# Patient Record
Sex: Female | Born: 1950 | Race: White | Hispanic: No | Marital: Married | Smoking: Never smoker
Health system: Southern US, Community
[De-identification: ages and names within clinical notes are randomized; demographics above are authoritative.]

## PROBLEM LIST (undated history)

## (undated) DIAGNOSIS — R7303 Prediabetes: Secondary | ICD-10-CM

## (undated) DIAGNOSIS — B019 Varicella without complication: Secondary | ICD-10-CM

## (undated) DIAGNOSIS — R51 Headache: Secondary | ICD-10-CM

## (undated) DIAGNOSIS — K219 Gastro-esophageal reflux disease without esophagitis: Secondary | ICD-10-CM

## (undated) DIAGNOSIS — K589 Irritable bowel syndrome without diarrhea: Secondary | ICD-10-CM

## (undated) DIAGNOSIS — Z9889 Other specified postprocedural states: Secondary | ICD-10-CM

## (undated) DIAGNOSIS — M810 Age-related osteoporosis without current pathological fracture: Secondary | ICD-10-CM

## (undated) DIAGNOSIS — I890 Lymphedema, not elsewhere classified: Secondary | ICD-10-CM

## (undated) DIAGNOSIS — M1990 Unspecified osteoarthritis, unspecified site: Secondary | ICD-10-CM

## (undated) DIAGNOSIS — R519 Headache, unspecified: Secondary | ICD-10-CM

## (undated) DIAGNOSIS — E785 Hyperlipidemia, unspecified: Secondary | ICD-10-CM

## (undated) DIAGNOSIS — E7800 Pure hypercholesterolemia, unspecified: Secondary | ICD-10-CM

## (undated) DIAGNOSIS — H25013 Cortical age-related cataract, bilateral: Secondary | ICD-10-CM

## (undated) DIAGNOSIS — I10 Essential (primary) hypertension: Secondary | ICD-10-CM

## (undated) DIAGNOSIS — R112 Nausea with vomiting, unspecified: Secondary | ICD-10-CM

## (undated) DIAGNOSIS — J45909 Unspecified asthma, uncomplicated: Secondary | ICD-10-CM

## (undated) HISTORY — DX: Headache, unspecified: R51.9

## (undated) HISTORY — DX: Essential (primary) hypertension: I10

## (undated) HISTORY — DX: Headache: R51

## (undated) HISTORY — DX: Varicella without complication: B01.9

## (undated) HISTORY — PX: APPENDECTOMY: SHX54

## (undated) HISTORY — DX: Unspecified asthma, uncomplicated: J45.909

## (undated) HISTORY — PX: FOOT SURGERY: SHX648

## (undated) HISTORY — PX: TONSILLECTOMY: SUR1361

## (undated) HISTORY — PX: ABDOMINAL HYSTERECTOMY: SHX81

---
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
## 2002-07-25 HISTORY — PX: ABDOMINAL HYSTERECTOMY: SHX81

## 2008-07-23 ENCOUNTER — Inpatient Hospital Stay: Payer: Self-pay | Admitting: Specialist

## 2009-10-30 ENCOUNTER — Ambulatory Visit: Payer: Self-pay | Admitting: Family Medicine

## 2010-02-25 ENCOUNTER — Inpatient Hospital Stay: Payer: Self-pay | Admitting: Internal Medicine

## 2010-11-29 ENCOUNTER — Ambulatory Visit: Payer: Self-pay | Admitting: Family Medicine

## 2010-12-06 ENCOUNTER — Observation Stay: Payer: Self-pay | Admitting: Internal Medicine

## 2012-07-04 ENCOUNTER — Ambulatory Visit: Payer: Self-pay | Admitting: Podiatry

## 2013-12-03 DIAGNOSIS — I10 Essential (primary) hypertension: Secondary | ICD-10-CM | POA: Insufficient documentation

## 2013-12-03 DIAGNOSIS — G609 Hereditary and idiopathic neuropathy, unspecified: Secondary | ICD-10-CM | POA: Insufficient documentation

## 2013-12-03 DIAGNOSIS — G43909 Migraine, unspecified, not intractable, without status migrainosus: Secondary | ICD-10-CM | POA: Insufficient documentation

## 2013-12-03 DIAGNOSIS — L309 Dermatitis, unspecified: Secondary | ICD-10-CM | POA: Insufficient documentation

## 2013-12-03 DIAGNOSIS — S52599A Other fractures of lower end of unspecified radius, initial encounter for closed fracture: Secondary | ICD-10-CM | POA: Insufficient documentation

## 2013-12-03 DIAGNOSIS — F419 Anxiety disorder, unspecified: Secondary | ICD-10-CM | POA: Insufficient documentation

## 2014-02-17 ENCOUNTER — Ambulatory Visit: Payer: Self-pay | Admitting: Family Medicine

## 2014-03-19 ENCOUNTER — Ambulatory Visit: Payer: Self-pay | Admitting: Family Medicine

## 2015-03-11 ENCOUNTER — Other Ambulatory Visit: Payer: Self-pay | Admitting: Family Medicine

## 2015-03-11 DIAGNOSIS — Z1239 Encounter for other screening for malignant neoplasm of breast: Secondary | ICD-10-CM

## 2015-03-23 ENCOUNTER — Ambulatory Visit
Admission: RE | Admit: 2015-03-23 | Discharge: 2015-03-23 | Disposition: A | Discharge status: Home / Self Care | Payer: 59 | Source: Ambulatory Visit | Attending: Family Medicine | Admitting: Family Medicine

## 2015-03-23 DIAGNOSIS — Z1231 Encounter for screening mammogram for malignant neoplasm of breast: Secondary | ICD-10-CM | POA: Insufficient documentation

## 2015-03-23 DIAGNOSIS — Z1239 Encounter for other screening for malignant neoplasm of breast: Secondary | ICD-10-CM

## 2016-02-11 ENCOUNTER — Encounter: Payer: Self-pay | Admitting: Family Medicine

## 2016-02-11 ENCOUNTER — Ambulatory Visit (INDEPENDENT_AMBULATORY_CARE_PROVIDER_SITE_OTHER): Payer: 59 | Admitting: Family Medicine

## 2016-02-11 VITALS — BP 132/90 | HR 52 | Ht 62.0 in | Wt 149.6 lb

## 2016-02-11 DIAGNOSIS — Z0000 Encounter for general adult medical examination without abnormal findings: Secondary | ICD-10-CM | POA: Diagnosis not present

## 2016-02-11 DIAGNOSIS — Z130 Encounter for screening for diseases of the blood and blood-forming organs and certain disorders involving the immune mechanism: Secondary | ICD-10-CM

## 2016-02-11 DIAGNOSIS — I10 Essential (primary) hypertension: Secondary | ICD-10-CM | POA: Diagnosis not present

## 2016-02-11 DIAGNOSIS — E663 Overweight: Secondary | ICD-10-CM

## 2016-02-11 LAB — LIPID PANEL
CHOLESTEROL: 202 mg/dL — AB (ref 0–200)
HDL: 64.4 mg/dL (ref >39.00)
LDL CALC: 126 mg/dL — AB (ref 0–99)
NonHDL: 138.02
Total CHOL/HDL Ratio: 3
Triglycerides: 61 mg/dL (ref 0.0–149.0)
VLDL: 12.2 mg/dL (ref 0.0–40.0)

## 2016-02-11 LAB — CBC
HEMATOCRIT: 43.2 % (ref 36.0–46.0)
HEMOGLOBIN: 14.2 g/dL (ref 12.0–15.0)
MCHC: 32.8 g/dL (ref 30.0–36.0)
MCV: 90.6 fl (ref 78.0–100.0)
PLATELETS: 248 10*3/uL (ref 150.0–400.0)
RBC: 4.76 Mil/uL (ref 3.87–5.11)
RDW: 13.6 % (ref 11.5–15.5)
WBC: 5.4 10*3/uL (ref 4.0–10.5)

## 2016-02-11 LAB — COMPREHENSIVE METABOLIC PANEL
ALBUMIN: 4.2 g/dL (ref 3.5–5.2)
ALK PHOS: 84 U/L (ref 39–117)
ALT: 20 U/L (ref 0–35)
AST: 25 U/L (ref 0–37)
BUN: 14 mg/dL (ref 6–23)
CHLORIDE: 102 meq/L (ref 96–112)
CO2: 29 mEq/L (ref 19–32)
CREATININE: 0.88 mg/dL (ref 0.40–1.20)
Calcium: 9.8 mg/dL (ref 8.4–10.5)
GFR: 68.61 mL/min (ref >60.00)
Glucose, Bld: 85 mg/dL (ref 70–99)
Potassium: 4.2 mEq/L (ref 3.5–5.1)
SODIUM: 139 meq/L (ref 135–145)
TOTAL PROTEIN: 7.1 g/dL (ref 6.0–8.3)
Total Bilirubin: 0.6 mg/dL (ref 0.2–1.2)

## 2016-02-11 LAB — HEMOGLOBIN A1C: HEMOGLOBIN A1C: 5.5 % (ref 4.6–6.5)

## 2016-02-11 MED ORDER — CLOBETASOL PROPIONATE 0.05 % EX SOLN: 1.0000 "application " | CUTANEOUS | Status: DC | PRN

## 2016-02-11 MED ORDER — ALBUTEROL SULFATE HFA 108 (90 BASE) MCG/ACT IN AERS: 2.0000 | INHALATION_SPRAY | Freq: Four times a day (QID) | RESPIRATORY_TRACT | Status: DC | PRN

## 2016-02-11 MED ORDER — DESONIDE 0.05 % EX OINT: 1.0000 "application " | TOPICAL_OINTMENT | Freq: Two times a day (BID) | CUTANEOUS | Status: DC

## 2016-02-11 MED ORDER — LOSARTAN POTASSIUM-HCTZ 100-25 MG PO TABS: 1.0000 | ORAL_TABLET | Freq: Every day | ORAL | Status: DC

## 2016-02-11 NOTE — Patient Instructions (Signed)
We will call with your lab results.  Follow up in 6 months.  Take care  Dr. Lacinda Axon   Health Maintenance, Female Adopting a healthy lifestyle and getting preventive care can go a long way to promote health and wellness. Talk with your health care provider about what schedule of regular examinations is right for you. This is a good chance for you to check in with your provider about disease prevention and staying healthy. In between checkups, there are plenty of things you can do on your own. Experts have done a lot of research about which lifestyle changes and preventive measures are most likely to keep you healthy. Ask your health care provider for more information. WEIGHT AND DIET  Eat a healthy diet  Be sure to include plenty of vegetables, fruits, low-fat dairy products, and lean protein.  Do not eat a lot of foods high in solid fats, added sugars, or salt.  Get regular exercise. This is one of the most important things you can do for your health.  Most adults should exercise for at least 150 minutes each week. The exercise should increase your heart rate and make you sweat (moderate-intensity exercise).  Most adults should also do strengthening exercises at least twice a week. This is in addition to the moderate-intensity exercise.  Maintain a healthy weight  Body mass index (BMI) is a measurement that can be used to identify possible weight problems. It estimates body fat based on height and weight. Your health care provider can help determine your BMI and help you achieve or maintain a healthy weight.  For females 26 years of age and older:   A BMI below 18.5 is considered underweight.  A BMI of 18.5 to 24.9 is normal.  A BMI of 25 to 29.9 is considered overweight.  A BMI of 30 and above is considered obese.  Watch levels of cholesterol and blood lipids  You should start having your blood tested for lipids and cholesterol at 65 years of age, then have this test every 5  years.  You may need to have your cholesterol levels checked more often if:  Your lipid or cholesterol levels are high.  You are older than 65 years of age.  You are at high risk for heart disease.  CANCER SCREENING   Lung Cancer  Lung cancer screening is recommended for adults 63-61 years old who are at high risk for lung cancer because of a history of smoking.  A yearly low-dose CT scan of the lungs is recommended for people who:  Currently smoke.  Have quit within the past 15 years.  Have at least a 30-pack-year history of smoking. A pack year is smoking an average of one pack of cigarettes a day for 1 year.  Yearly screening should continue until it has been 15 years since you quit.  Yearly screening should stop if you develop a health problem that would prevent you from having lung cancer treatment.  Breast Cancer  Practice breast self-awareness. This means understanding how your breasts normally appear and feel.  It also means doing regular breast self-exams. Let your health care provider know about any changes, no matter how small.  If you are in your 20s or 30s, you should have a clinical breast exam (CBE) by a health care provider every 1-3 years as part of a regular health exam.  If you are 47 or older, have a CBE every year. Also consider having a breast X-ray (mammogram) every year.  If you have a family history of breast cancer, talk to your health care provider about genetic screening.  If you are at high risk for breast cancer, talk to your health care provider about having an MRI and a mammogram every year.  Breast cancer gene (BRCA) assessment is recommended for women who have family members with BRCA-related cancers. BRCA-related cancers include:  Breast.  Ovarian.  Tubal.  Peritoneal cancers.  Results of the assessment will determine the need for genetic counseling and BRCA1 and BRCA2 testing. Cervical Cancer Your health care provider may  recommend that you be screened regularly for cancer of the pelvic organs (ovaries, uterus, and vagina). This screening involves a pelvic examination, including checking for microscopic changes to the surface of your cervix (Pap test). You may be encouraged to have this screening done every 3 years, beginning at age 82.  For women ages 69-65, health care providers may recommend pelvic exams and Pap testing every 3 years, or they may recommend the Pap and pelvic exam, combined with testing for human papilloma virus (HPV), every 5 years. Some types of HPV increase your risk of cervical cancer. Testing for HPV may also be done on women of any age with unclear Pap test results.  Other health care providers may not recommend any screening for nonpregnant women who are considered low risk for pelvic cancer and who do not have symptoms. Ask your health care provider if a screening pelvic exam is right for you.  If you have had past treatment for cervical cancer or a condition that could lead to cancer, you need Pap tests and screening for cancer for at least 20 years after your treatment. If Pap tests have been discontinued, your risk factors (such as having a new sexual partner) need to be reassessed to determine if screening should resume. Some women have medical problems that increase the chance of getting cervical cancer. In these cases, your health care provider may recommend more frequent screening and Pap tests. Colorectal Cancer  This type of cancer can be detected and often prevented.  Routine colorectal cancer screening usually begins at 65 years of age and continues through 65 years of age.  Your health care provider may recommend screening at an earlier age if you have risk factors for colon cancer.  Your health care provider may also recommend using home test kits to check for hidden blood in the stool.  A small camera at the end of a tube can be used to examine your colon directly  (sigmoidoscopy or colonoscopy). This is done to check for the earliest forms of colorectal cancer.  Routine screening usually begins at age 71.  Direct examination of the colon should be repeated every 5-10 years through 65 years of age. However, you may need to be screened more often if early forms of precancerous polyps or small growths are found. Skin Cancer  Check your skin from head to toe regularly.  Tell your health care provider about any new moles or changes in moles, especially if there is a change in a mole's shape or color.  Also tell your health care provider if you have a mole that is larger than the size of a pencil eraser.  Always use sunscreen. Apply sunscreen liberally and repeatedly throughout the day.  Protect yourself by wearing long sleeves, pants, a wide-brimmed hat, and sunglasses whenever you are outside. HEART DISEASE, DIABETES, AND HIGH BLOOD PRESSURE   High blood pressure causes heart disease and increases the risk  of stroke. High blood pressure is more likely to develop in:  People who have blood pressure in the high end of the normal range (130-139/85-89 mm Hg).  People who are overweight or obese.  People who are African American.  If you are 77-22 years of age, have your blood pressure checked every 3-5 years. If you are 9 years of age or older, have your blood pressure checked every year. You should have your blood pressure measured twice--once when you are at a hospital or clinic, and once when you are not at a hospital or clinic. Record the average of the two measurements. To check your blood pressure when you are not at a hospital or clinic, you can use:  An automated blood pressure machine at a pharmacy.  A home blood pressure monitor.  If you are between 44 years and 53 years old, ask your health care provider if you should take aspirin to prevent strokes.  Have regular diabetes screenings. This involves taking a blood sample to check your  fasting blood sugar level.  If you are at a normal weight and have a low risk for diabetes, have this test once every three years after 65 years of age.  If you are overweight and have a high risk for diabetes, consider being tested at a younger age or more often. PREVENTING INFECTION  Hepatitis B  If you have a higher risk for hepatitis B, you should be screened for this virus. You are considered at high risk for hepatitis B if:  You were born in a country where hepatitis B is common. Ask your health care provider which countries are considered high risk.  Your parents were born in a high-risk country, and you have not been immunized against hepatitis B (hepatitis B vaccine).  You have HIV or AIDS.  You use needles to inject street drugs.  You live with someone who has hepatitis B.  You have had sex with someone who has hepatitis B.  You get hemodialysis treatment.  You take certain medicines for conditions, including cancer, organ transplantation, and autoimmune conditions. Hepatitis C  Blood testing is recommended for:  Everyone born from [birth year] through 1965.  Anyone with known risk factors for hepatitis C. Sexually transmitted infections (STIs)  You should be screened for sexually transmitted infections (STIs) including gonorrhea and chlamydia if:  You are sexually active and are younger than 65 years of age.  You are older than 65 years of age and your health care provider tells you that you are at risk for this type of infection.  Your sexual activity has changed since you were last screened and you are at an increased risk for chlamydia or gonorrhea. Ask your health care provider if you are at risk.  If you do not have HIV, but are at risk, it may be recommended that you take a prescription medicine daily to prevent HIV infection. This is called pre-exposure prophylaxis (PrEP). You are considered at risk if:  You are sexually active and do not regularly use condoms or  know the HIV status of your partner(s).  You take drugs by injection.  You are sexually active with a partner who has HIV. Talk with your health care provider about whether you are at high risk of being infected with HIV. If you choose to begin PrEP, you should first be tested for HIV. You should then be tested every 3 months for as long as you are taking PrEP.  PREGNANCY  If you are premenopausal and you may become pregnant, ask your health care provider about preconception counseling.  If you may become pregnant, take 400 to 800 micrograms (mcg) of folic acid every day.  If you want to prevent pregnancy, talk to your health care provider about birth control (contraception). OSTEOPOROSIS AND MENOPAUSE   Osteoporosis is a disease in which the bones lose minerals and strength with aging. This can result in serious bone fractures. Your risk for osteoporosis can be identified using a bone density scan.  If you are 30 years of age or older, or if you are at risk for osteoporosis and fractures, ask your health care provider if you should be screened.  Ask your health care provider whether you should take a calcium or vitamin D supplement to lower your risk for osteoporosis.  Menopause may have certain physical symptoms and risks.  Hormone replacement therapy may reduce some of these symptoms and risks. Talk to your health care provider about whether hormone replacement therapy is right for you.  HOME CARE INSTRUCTIONS   Schedule regular health, dental, and eye exams.  Stay current with your immunizations.   Do not use any tobacco products including cigarettes, chewing tobacco, or electronic cigarettes.  If you are pregnant, do not drink alcohol.  If you are breastfeeding, limit how much and how often you drink alcohol.  Limit alcohol intake to no more than 1 drink per day for nonpregnant women. One drink equals 12 ounces of beer, 5 ounces of wine, or 1 ounces of hard liquor.  Do  not use street drugs.  Do not share needles.  Ask your health care provider for help if you need support or information about quitting drugs.  Tell your health care provider if you often feel depressed.  Tell your health care provider if you have ever been abused or do not feel safe at home.   This information is not intended to replace advice given to you by your health care provider. Make sure you discuss any questions you have with your health care provider.   Document Released: 01/24/2011 Document Revised: 08/01/2014 Document Reviewed: 06/12/2013 Elsevier Interactive Patient Education [employer].

## 2016-02-12 ENCOUNTER — Encounter: Payer: Self-pay | Admitting: Family Medicine

## 2016-02-12 DIAGNOSIS — Z0000 Encounter for general adult medical examination without abnormal findings: Secondary | ICD-10-CM | POA: Insufficient documentation

## 2016-02-12 DIAGNOSIS — G454 Transient global amnesia: Secondary | ICD-10-CM | POA: Insufficient documentation

## 2016-02-12 NOTE — Assessment & Plan Note (Addendum)
Unsure of last tetanus. Screening labs today. Mammogram up-to-date. Pap smear no longer needed secondary to hysterectomy. Patient to consider colon cancer screening. Patient to also consider immunizations.

## 2016-02-12 NOTE — Progress Notes (Signed)
Subjective:  Patient ID: Summer Bishop, female    DOB: 04-17-1951  Age: 65 y.o. MRN: [id number]  CC: Establish care  HPI Summer Bishop is a 65 y.o. female presents to the clinic today to establish care.  Preventative Healthcare  Pap smear: No longer needed.   Mammogram: Mammogram up to date.  Colonoscopy: Declines. Okay with Cologuard.   Immunizations  Tetanus - Unsure.  Pneumococcal - Not indicated.  Zoster - Candidate for.   Hepatitis C screening - Declines.  Labs: Screening labs today.   Exercise: No regular exercise.  Alcohol use: No.  Smoking/tobacco use: No.  PMH, Surgical Hx, Family Hx, Social History reviewed and updated as below.  Past Medical History  Diagnosis Date  . Hypertension   . Asthma   . Hypoglycemia     Reported history of Hypoglycemia  . Chicken pox   . Headache    Past Surgical History  Procedure Laterality Date  . Abdominal hysterectomy    . Appendectomy    . Tonsillectomy     Family History  Problem Relation Age of Onset  . Breast cancer Sister     late 77's  . Breast cancer Maternal Aunt     3 aunts  . Stroke Mother   . Dementia Mother   . Alzheimer's disease Father   . Kidney cancer Father   . Liver cancer Father   . Stroke Father   . Heart Problems Father    Social History  Substance Use Topics  . Smoking status: Never Smoker   . Smokeless tobacco: Never Used  . Alcohol Use: No   Review of Systems  Psychiatric/Behavioral:       Memory difficulties.  All other systems reviewed and are negative.  Objective:   Today's Vitals: BP 132/90 mmHg  Pulse 52  Ht _0  (1.575 m)  Wt 149 lb 9.6 oz (67.858 kg)  BMI 27.36 kg/m2  SpO2 97%  Physical Exam  Constitutional: She appears well-developed. No distress.  HENT:  Head: Normocephalic and atraumatic.  Mouth/Throat: Oropharynx is clear and moist.  Eyes: Conjunctivae are normal. No scleral icterus.  Neck: Neck supple.  Cardiovascular: Normal rate and regular rhythm.    Pulmonary/Chest: Effort normal. She has no wheezes. She has no rales.  Abdominal: Soft. She exhibits no distension. There is no tenderness. There is no rebound and no guarding.  Musculoskeletal: Normal range of motion. She exhibits no edema.  Neurological: She is alert.  Patient appears to have difficulties with remembering dates and times. She states that this occurred after an event where she was hospitalized.  Skin: Skin is warm and dry. No rash noted.  Psychiatric:  Flat affect.  Vitals reviewed.  Assessment & Plan:   Problem List Items Addressed This Visit    Annual physical exam - Primary    Unsure of last tetanus. Screening labs today. Mammogram up-to-date. Pap smear no longer needed secondary to hysterectomy. Patient to consider colon cancer screening. Patient to also consider immunizations.        Other Visit Diagnoses    Screening for deficiency anemia        Relevant Orders    CBC (Completed)    Essential hypertension        Relevant Medications    losartan-hydrochlorothiazide (HYZAAR) 100-25 MG tablet    Other Relevant Orders    Comp Met (CMET) (Completed)    Lipid Profile (Completed)    Overweight (BMI 25.0-29.9)        Relevant  Orders    HgB A1c (Completed)       Outpatient Encounter Prescriptions as of 02/11/2016  Medication Sig  . albuterol (PROVENTIL HFA;VENTOLIN HFA) 108 (90 Base) MCG/ACT inhaler Inhale 2 puffs into the lungs every 6 (six) hours as needed for wheezing or shortness of breath.  . clobetasol (TEMOVATE) 0.05 % external solution Apply 1 application topically as needed.  Marland Kitchen losartan-hydrochlorothiazide (HYZAAR) 100-25 MG tablet Take 1 tablet by mouth daily.  . [DISCONTINUED] albuterol (PROVENTIL HFA;VENTOLIN HFA) 108 (90 Base) MCG/ACT inhaler Inhale 2 puffs into the lungs as needed for wheezing or shortness of breath.  . [DISCONTINUED] clobetasol (TEMOVATE) 0.05 % external solution Apply 1 application topically as needed.  . [DISCONTINUED]  desonide (DESONATE) 0.05 % gel Apply 0.05 % topically as needed.  . [DISCONTINUED] losartan-hydrochlorothiazide (HYZAAR) 100-25 MG tablet Take 1 tablet by mouth daily.  Marland Kitchen desonide (DESOWEN) 0.05 % ointment Apply 1 application topically 2 (two) times daily.   No facility-administered encounter medications on file as of 02/11/2016.    Follow-up: 6 months.   Camilla

## 2016-04-06 ENCOUNTER — Other Ambulatory Visit: Payer: Self-pay | Admitting: Family Medicine

## 2016-04-06 DIAGNOSIS — Z1231 Encounter for screening mammogram for malignant neoplasm of breast: Secondary | ICD-10-CM

## 2016-04-27 ENCOUNTER — Ambulatory Visit
Admission: RE | Admit: 2016-04-27 | Discharge: 2016-04-27 | Disposition: A | Discharge status: Home / Self Care | Payer: 59 | Source: Ambulatory Visit | Attending: Family Medicine | Admitting: Family Medicine

## 2016-04-27 DIAGNOSIS — Z1231 Encounter for screening mammogram for malignant neoplasm of breast: Secondary | ICD-10-CM | POA: Diagnosis not present

## 2016-04-28 ENCOUNTER — Telehealth: Payer: Self-pay | Admitting: *Deleted

## 2016-04-28 ENCOUNTER — Other Ambulatory Visit: Payer: Self-pay | Admitting: Family Medicine

## 2016-04-28 DIAGNOSIS — Z1211 Encounter for screening for malignant neoplasm of colon: Secondary | ICD-10-CM

## 2016-04-28 NOTE — Telephone Encounter (Signed)
Ordered

## 2016-04-28 NOTE — Telephone Encounter (Signed)
Pt stated that she was to have a colongaurd test, however she did not receive the test. Pt [phone]

## 2016-04-28 NOTE — Telephone Encounter (Signed)
Can we order? 

## 2016-04-29 ENCOUNTER — Telehealth: Payer: Self-pay | Admitting: Family Medicine

## 2016-04-29 NOTE — Telephone Encounter (Signed)
Secondary voicemail left. Please transfer her to my ext.

## 2016-04-29 NOTE — Telephone Encounter (Signed)
See previous telephone note in pts chart.

## 2016-04-29 NOTE — Telephone Encounter (Signed)
LVTCB

## 2016-04-29 NOTE — Telephone Encounter (Signed)
Pt called returning your call. Thank you!  Call pt @ [phone]

## 2016-04-29 NOTE — Telephone Encounter (Signed)
Pt made aware of orders placed

## 2016-05-07 ENCOUNTER — Ambulatory Visit (INDEPENDENT_AMBULATORY_CARE_PROVIDER_SITE_OTHER): Payer: 59

## 2016-05-07 DIAGNOSIS — Z23 Encounter for immunization: Secondary | ICD-10-CM

## 2016-07-11 DIAGNOSIS — J4521 Mild intermittent asthma with (acute) exacerbation: Secondary | ICD-10-CM | POA: Diagnosis not present

## 2016-07-16 DIAGNOSIS — J4521 Mild intermittent asthma with (acute) exacerbation: Secondary | ICD-10-CM | POA: Diagnosis not present

## 2016-07-28 ENCOUNTER — Encounter: Payer: Self-pay | Admitting: Family Medicine

## 2016-07-28 ENCOUNTER — Ambulatory Visit (INDEPENDENT_AMBULATORY_CARE_PROVIDER_SITE_OTHER): Payer: PPO | Admitting: Family Medicine

## 2016-07-28 DIAGNOSIS — M898X1 Other specified disorders of bone, shoulder: Secondary | ICD-10-CM | POA: Diagnosis not present

## 2016-07-28 MED ORDER — CYCLOBENZAPRINE HCL 5 MG PO TABS: ORAL_TABLET | ORAL | 0 refills | Status: DC

## 2016-07-28 NOTE — Progress Notes (Signed)
   Subjective:  Patient ID: Summer Bishop, female    DOB: October 06, 1950  Age: 66 y.o. MRN: [id number]  CC: Back pain, cough  HPI:  66 year old female presents for acute visit with the above complaints.  Patient has recently had difficulty with asthma. She was seen at [facility] walk-in clinic twice in December for asthma exacerbations. She has had improvement in her symptoms with prednisone and doxycycline. However, she's had continued cough. She has recently developed left scapular pain, likely from the cough. Cough is improving. Pain is mild to moderate in severity. Sharp. Worse with cough. No no relieving factors. No reports of shortness of breath. No recent fever. No other associated symptoms. No other complaints at this time.  Social Hx   Social History   Social History  . Marital status: Married    Spouse name: N/A  . Number of children: N/A  . Years of education: N/A   Social History Main Topics  . Smoking status: Never Smoker  . Smokeless tobacco: Never Used  . Alcohol use No  . Drug use: Unknown  . Sexual activity: Not Asked   Other Topics Concern  . None   Social History Narrative  . None   Review of Systems  Constitutional: Negative for fever.  Respiratory: Positive for cough.   Musculoskeletal: Positive for back pain.   Objective:  BP (!) 159/82   Pulse 68   Temp 97.5 F (36.4 C) (Oral)   Resp 12   Wt 140 lb 12.8 oz (63.9 kg)   SpO2 99%   BMI 25.75 kg/m   BP/Weight 07/28/2016 Q000111Q  Systolic BP Q000111Q Q000111Q  Diastolic BP 82 90  Wt. (Lbs) 140.8 149.6  BMI 25.75 27.36   Physical Exam  Constitutional: She appears well-developed. No distress.  Cardiovascular: Normal rate and regular rhythm.   Pulmonary/Chest: Effort normal and breath sounds normal.  Musculoskeletal:  Tenderness around the left scapula, towards midline.  Neurological: She is alert.  Vitals reviewed.  Lab Results  Component Value Date   WBC 5.4 02/11/2016   HGB 14.2 02/11/2016   HCT 43.2  02/11/2016   PLT 248.0 02/11/2016   GLUCOSE 85 02/11/2016   CHOL 202 (H) 02/11/2016   TRIG 61.0 02/11/2016   HDL 64.40 02/11/2016   LDLCALC 126 (H) 02/11/2016   ALT 20 02/11/2016   AST 25 02/11/2016   NA 139 02/11/2016   K 4.2 02/11/2016   CL 102 02/11/2016   CREATININE 0.88 02/11/2016   BUN 14 02/11/2016   CO2 29 02/11/2016   HGBA1C 5.5 02/11/2016    Assessment & Plan:   Problem List Items Addressed This Visit    Periscapular pain    New problem. Likely from frequent coughing.  Spasmodic. Treating with Flexeril. Lungs clear, no evidence of asthma exacerbation, pneumonia, or other underlying respiratory illness.         Meds ordered this encounter  Medications  . cyclobenzaprine (FLEXERIL) 5 MG tablet    Sig: 1 tablet 3 times daily as needed for back pain/spasm.    Dispense:  30 tablet    Refill:  0   Follow-up: 6 months   [place] DO [facility]

## 2016-07-28 NOTE — Assessment & Plan Note (Signed)
New problem. Likely from frequent coughing.  Spasmodic. Treating with Flexeril. Lungs clear, no evidence of asthma exacerbation, pneumonia, or other underlying respiratory illness.

## 2016-07-28 NOTE — Progress Notes (Signed)
Pre visit review using our clinic review tool, if applicable. No additional management support is needed unless otherwise documented below in the visit note. 

## 2016-07-28 NOTE — Patient Instructions (Signed)
Use the medication at night (at first). If you tolerate well you can use it during the day as well.  Heat as needed.  Take care  Dr. Lacinda Axon

## 2016-08-17 ENCOUNTER — Ambulatory Visit: Payer: 59 | Admitting: Family Medicine

## 2016-10-20 DIAGNOSIS — M7989 Other specified soft tissue disorders: Secondary | ICD-10-CM | POA: Diagnosis not present

## 2016-10-20 DIAGNOSIS — S99912A Unspecified injury of left ankle, initial encounter: Secondary | ICD-10-CM | POA: Diagnosis not present

## 2016-10-20 DIAGNOSIS — S93402A Sprain of unspecified ligament of left ankle, initial encounter: Secondary | ICD-10-CM | POA: Diagnosis not present

## 2017-02-14 ENCOUNTER — Ambulatory Visit (INDEPENDENT_AMBULATORY_CARE_PROVIDER_SITE_OTHER): Payer: PPO | Admitting: Family Medicine

## 2017-02-14 ENCOUNTER — Encounter: Payer: Self-pay | Admitting: Family Medicine

## 2017-02-14 VITALS — BP 124/88 | HR 69 | Temp 98.3°F | Ht 62.0 in | Wt 142.2 lb

## 2017-02-14 DIAGNOSIS — Z23 Encounter for immunization: Secondary | ICD-10-CM | POA: Diagnosis not present

## 2017-02-14 DIAGNOSIS — B070 Plantar wart: Secondary | ICD-10-CM | POA: Diagnosis not present

## 2017-02-14 DIAGNOSIS — E7800 Pure hypercholesterolemia, unspecified: Secondary | ICD-10-CM | POA: Diagnosis not present

## 2017-02-14 DIAGNOSIS — E2839 Other primary ovarian failure: Secondary | ICD-10-CM

## 2017-02-14 DIAGNOSIS — Z0000 Encounter for general adult medical examination without abnormal findings: Secondary | ICD-10-CM | POA: Diagnosis not present

## 2017-02-14 DIAGNOSIS — Z1211 Encounter for screening for malignant neoplasm of colon: Secondary | ICD-10-CM | POA: Diagnosis not present

## 2017-02-14 DIAGNOSIS — K625 Hemorrhage of anus and rectum: Secondary | ICD-10-CM | POA: Diagnosis not present

## 2017-02-14 DIAGNOSIS — I10 Essential (primary) hypertension: Secondary | ICD-10-CM

## 2017-02-14 DIAGNOSIS — K589 Irritable bowel syndrome without diarrhea: Secondary | ICD-10-CM | POA: Insufficient documentation

## 2017-02-14 LAB — CBC
HEMATOCRIT: 43.8 % (ref 36.0–46.0)
Hemoglobin: 14.3 g/dL (ref 12.0–15.0)
MCHC: 32.7 g/dL (ref 30.0–36.0)
MCV: 93 fl (ref 78.0–100.0)
Platelets: 251 10*3/uL (ref 150.0–400.0)
RBC: 4.71 Mil/uL (ref 3.87–5.11)
RDW: 13.7 % (ref 11.5–15.5)
WBC: 6.1 10*3/uL (ref 4.0–10.5)

## 2017-02-14 LAB — LIPID PANEL
Cholesterol: 209 mg/dL — ABNORMAL HIGH (ref 0–200)
HDL: 64.8 mg/dL (ref >39.00)
LDL Cholesterol: 127 mg/dL — ABNORMAL HIGH (ref 0–99)
NONHDL: 143.97
Total CHOL/HDL Ratio: 3
Triglycerides: 86 mg/dL (ref 0.0–149.0)
VLDL: 17.2 mg/dL (ref 0.0–40.0)

## 2017-02-14 LAB — COMPREHENSIVE METABOLIC PANEL
ALK PHOS: 81 U/L (ref 39–117)
ALT: 17 U/L (ref 0–35)
AST: 20 U/L (ref 0–37)
Albumin: 4.2 g/dL (ref 3.5–5.2)
BILIRUBIN TOTAL: 0.6 mg/dL (ref 0.2–1.2)
BUN: 19 mg/dL (ref 6–23)
CO2: 30 mEq/L (ref 19–32)
CREATININE: 0.87 mg/dL (ref 0.40–1.20)
Calcium: 9.6 mg/dL (ref 8.4–10.5)
Chloride: 100 mEq/L (ref 96–112)
GFR: 69.3 mL/min (ref >60.00)
GLUCOSE: 96 mg/dL (ref 70–99)
Potassium: 3.8 mEq/L (ref 3.5–5.1)
Sodium: 137 mEq/L (ref 135–145)
TOTAL PROTEIN: 6.6 g/dL (ref 6.0–8.3)

## 2017-02-14 MED ORDER — ALBUTEROL SULFATE HFA 108 (90 BASE) MCG/ACT IN AERS: 2.0000 | INHALATION_SPRAY | Freq: Four times a day (QID) | RESPIRATORY_TRACT | 0 refills | Status: AC | PRN

## 2017-02-14 NOTE — Patient Instructions (Addendum)
We will call regarding the colonoscopy and bone density test.  We will call regarding lab results.  Follow up annually.  Take care  Dr. Lacinda Axon  Health Maintenance, Female Adopting a healthy lifestyle and getting preventive care can go a long way to promote health and wellness. Talk with your health care provider about what schedule of regular examinations is right for you. This is a good chance for you to check in with your provider about disease prevention and staying healthy. In between checkups, there are plenty of things you can do on your own. Experts have done a lot of research about which lifestyle changes and preventive measures are most likely to keep you healthy. Ask your health care provider for more information. Weight and diet Eat a healthy diet  Be sure to include plenty of vegetables, fruits, low-fat dairy products, and lean protein.  Do not eat a lot of foods high in solid fats, added sugars, or salt.  Get regular exercise. This is one of the most important things you can do for your health. ? Most adults should exercise for at least 150 minutes each week. The exercise should increase your heart rate and make you sweat (moderate-intensity exercise). ? Most adults should also do strengthening exercises at least twice a week. This is in addition to the moderate-intensity exercise.  Maintain a healthy weight  Body mass index (BMI) is a measurement that can be used to identify possible weight problems. It estimates body fat based on height and weight. Your health care provider can help determine your BMI and help you achieve or maintain a healthy weight.  For females 49 years of age and older: ? A BMI below 18.5 is considered underweight. ? A BMI of 18.5 to 24.9 is normal. ? A BMI of 25 to 29.9 is considered overweight. ? A BMI of 30 and above is considered obese.  Watch levels of cholesterol and blood lipids  You should start having your blood tested for lipids and  cholesterol at 67 years of age, then have this test every 5 years.  You may need to have your cholesterol levels checked more often if: ? Your lipid or cholesterol levels are high. ? You are older than 66 years of age. ? You are at high risk for heart disease.  Cancer screening Lung Cancer  Lung cancer screening is recommended for adults 80-24 years old who are at high risk for lung cancer because of a history of smoking.  A yearly low-dose CT scan of the lungs is recommended for people who: ? Currently smoke. ? Have quit within the past 15 years. ? Have at least a 30-pack-year history of smoking. A pack year is smoking an average of one pack of cigarettes a day for 1 year.  Yearly screening should continue until it has been 15 years since you quit.  Yearly screening should stop if you develop a health problem that would prevent you from having lung cancer treatment.  Breast Cancer  Practice breast self-awareness. This means understanding how your breasts normally appear and feel.  It also means doing regular breast self-exams. Let your health care provider know about any changes, no matter how small.  If you are in your 20s or 30s, you should have a clinical breast exam (CBE) by a health care provider every 1-3 years as part of a regular health exam.  If you are 57 or older, have a CBE every year. Also consider having a breast X-ray (mammogram)  every year.  If you have a family history of breast cancer, talk to your health care provider about genetic screening.  If you are at high risk for breast cancer, talk to your health care provider about having an MRI and a mammogram every year.  Breast cancer gene (BRCA) assessment is recommended for women who have family members with BRCA-related cancers. BRCA-related cancers include: ? Breast. ? Ovarian. ? Tubal. ? Peritoneal cancers.  Results of the assessment will determine the need for genetic counseling and BRCA1 and BRCA2  testing.  Cervical Cancer Your health care provider may recommend that you be screened regularly for cancer of the pelvic organs (ovaries, uterus, and vagina). This screening involves a pelvic examination, including checking for microscopic changes to the surface of your cervix (Pap test). You may be encouraged to have this screening done every 3 years, beginning at age 57.  For women ages 18-65, health care providers may recommend pelvic exams and Pap testing every 3 years, or they may recommend the Pap and pelvic exam, combined with testing for human papilloma virus (HPV), every 5 years. Some types of HPV increase your risk of cervical cancer. Testing for HPV may also be done on women of any age with unclear Pap test results.  Other health care providers may not recommend any screening for nonpregnant women who are considered low risk for pelvic cancer and who do not have symptoms. Ask your health care provider if a screening pelvic exam is right for you.  If you have had past treatment for cervical cancer or a condition that could lead to cancer, you need Pap tests and screening for cancer for at least 20 years after your treatment. If Pap tests have been discontinued, your risk factors (such as having a new sexual partner) need to be reassessed to determine if screening should resume. Some women have medical problems that increase the chance of getting cervical cancer. In these cases, your health care provider may recommend more frequent screening and Pap tests.  Colorectal Cancer  This type of cancer can be detected and often prevented.  Routine colorectal cancer screening usually begins at 66 years of age and continues through 66 years of age.  Your health care provider may recommend screening at an earlier age if you have risk factors for colon cancer.  Your health care provider may also recommend using home test kits to check for hidden blood in the stool.  A small camera at the end of a  tube can be used to examine your colon directly (sigmoidoscopy or colonoscopy). This is done to check for the earliest forms of colorectal cancer.  Routine screening usually begins at age 67.  Direct examination of the colon should be repeated every 5-10 years through 66 years of age. However, you may need to be screened more often if early forms of precancerous polyps or small growths are found.  Skin Cancer  Check your skin from head to toe regularly.  Tell your health care provider about any new moles or changes in moles, especially if there is a change in a mole's shape or color.  Also tell your health care provider if you have a mole that is larger than the size of a pencil eraser.  Always use sunscreen. Apply sunscreen liberally and repeatedly throughout the day.  Protect yourself by wearing long sleeves, pants, a wide-brimmed hat, and sunglasses whenever you are outside.  Heart disease, diabetes, and high blood pressure  High blood pressure causes  heart disease and increases the risk of stroke. High blood pressure is more likely to develop in: ? People who have blood pressure in the high end of the normal range (130-139/85-89 mm Hg). ? People who are overweight or obese. ? People who are African American.  If you are 78-68 years of age, have your blood pressure checked every 3-5 years. If you are 70 years of age or older, have your blood pressure checked every year. You should have your blood pressure measured twice-once when you are at a hospital or clinic, and once when you are not at a hospital or clinic. Record the average of the two measurements. To check your blood pressure when you are not at a hospital or clinic, you can use: ? An automated blood pressure machine at a pharmacy. ? A home blood pressure monitor.  If you are between 22 years and 73 years old, ask your health care provider if you should take aspirin to prevent strokes.  Have regular diabetes screenings. This  involves taking a blood sample to check your fasting blood sugar level. ? If you are at a normal weight and have a low risk for diabetes, have this test once every three years after 66 years of age. ? If you are overweight and have a high risk for diabetes, consider being tested at a younger age or more often. Preventing infection Hepatitis B  If you have a higher risk for hepatitis B, you should be screened for this virus. You are considered at high risk for hepatitis B if: ? You were born in a country where hepatitis B is common. Ask your health care provider which countries are considered high risk. ? Your parents were born in a high-risk country, and you have not been immunized against hepatitis B (hepatitis B vaccine). ? You have HIV or AIDS. ? You use needles to inject street drugs. ? You live with someone who has hepatitis B. ? You have had sex with someone who has hepatitis B. ? You get hemodialysis treatment. ? You take certain medicines for conditions, including cancer, organ transplantation, and autoimmune conditions.  Hepatitis C  Blood testing is recommended for: ? Everyone born from [birth year] through 1965. ? Anyone with known risk factors for hepatitis C.  Sexually transmitted infections (STIs)  You should be screened for sexually transmitted infections (STIs) including gonorrhea and chlamydia if: ? You are sexually active and are younger than 66 years of age. ? You are older than 66 years of age and your health care provider tells you that you are at risk for this type of infection. ? Your sexual activity has changed since you were last screened and you are at an increased risk for chlamydia or gonorrhea. Ask your health care provider if you are at risk.  If you do not have HIV, but are at risk, it may be recommended that you take a prescription medicine daily to prevent HIV infection. This is called pre-exposure prophylaxis (PrEP). You are considered at risk if: ? You are  sexually active and do not regularly use condoms or know the HIV status of your partner(s). ? You take drugs by injection. ? You are sexually active with a partner who has HIV.  Talk with your health care provider about whether you are at high risk of being infected with HIV. If you choose to begin PrEP, you should first be tested for HIV. You should then be tested every 3 months for as long as  you are taking PrEP. Pregnancy  If you are premenopausal and you may become pregnant, ask your health care provider about preconception counseling.  If you may become pregnant, take 400 to 800 micrograms (mcg) of folic acid every day.  If you want to prevent pregnancy, talk to your health care provider about birth control (contraception). Osteoporosis and menopause  Osteoporosis is a disease in which the bones lose minerals and strength with aging. This can result in serious bone fractures. Your risk for osteoporosis can be identified using a bone density scan.  If you are 51 years of age or older, or if you are at risk for osteoporosis and fractures, ask your health care provider if you should be screened.  Ask your health care provider whether you should take a calcium or vitamin D supplement to lower your risk for osteoporosis.  Menopause may have certain physical symptoms and risks.  Hormone replacement therapy may reduce some of these symptoms and risks. Talk to your health care provider about whether hormone replacement therapy is right for you. Follow these instructions at home:  Schedule regular health, dental, and eye exams.  Stay current with your immunizations.  Do not use any tobacco products including cigarettes, chewing tobacco, or electronic cigarettes.  If you are pregnant, do not drink alcohol.  If you are breastfeeding, limit how much and how often you drink alcohol.  Limit alcohol intake to no more than 1 drink per day for nonpregnant women. One drink equals 12 ounces of  beer, 5 ounces of wine, or 1 ounces of hard liquor.  Do not use street drugs.  Do not share needles.  Ask your health care provider for help if you need support or information about quitting drugs.  Tell your health care provider if you often feel depressed.  Tell your health care provider if you have ever been abused or do not feel safe at home. This information is not intended to replace advice given to you by your health care provider. Make sure you discuss any questions you have with your health care provider. Document Released: 01/24/2011 Document Revised: 12/17/2015 Document Reviewed: 04/14/2015 Elsevier Interactive Patient Education  [employer].

## 2017-02-14 NOTE — Assessment & Plan Note (Signed)
New problem. Now resolved. Needs colonoscopy. Will arrange. CBC today.

## 2017-02-14 NOTE — Assessment & Plan Note (Signed)
New problem. Sending to podiatry for treatment.

## 2017-02-14 NOTE — Progress Notes (Signed)
Subjective:    Summer Bishop is a 66 y.o. female who presents for a Welcome to Medicare exam.   Current complaints:  Rectal bleeding  Patient reports that over the past 2-3 weeks she's had intermittent rectal bleeding.  She has noticed blood with wiping and blood in her undergarments.  She reports that she has a history of IBS.  No blood noted in the toilet.  No associated pain.  No known inciting factor. No reported abdominal pain.  She states that her bleeding has now resolved as of this week.  Last colonoscopy was 10 years ago.  Lesions on her feet  Patient reports that she's had several lesions on the bottom of her feet for quite some time.  They become intermittently painful.  She has cut them out/off previously with improvement.  She has 2 lesions on her right foot currently.  She would like these addressed.  ROS: Rectal bleeding, areas on soles of feet. Remainder of ROS negative.  Cardiac Risk Factors include: advanced age (>13men, >71 women);dyslipidemia;hypertension      Objective:    Today's Vitals   02/14/17 1012  BP: 124/88  Pulse: 69  Temp: 98.3 F (36.8 C)  TempSrc: Oral  SpO2: 99%  Weight: 142 lb 4 oz (64.5 kg)  Height: 5\' 2"  (1.575 m)  Body mass index is 26.02 kg/m.  Physical Exam   Gen.: Well-appearing elderly female in no acute distress. HENT: Normocephalic atraumatic. Normal TMs bilaterally. Oropharynx clear.  Eyes: No scleral icterus. Normal conjunctiva. Neck: Supple. No thyromegaly or adenopathy. Heart: Regular rate and rhythm. No murmurs appreciated. No lower extremity edema. Lungs: Clear auscultation bilaterally. No rales, rhonchi, or wheezing. Abdomen: Soft, nontender, nondistended. No palpable organomegaly. No rebound or guarding. MSK: Normal range of motion. Neuro: Alert and oriented x 3. No focal deficits. Psych: Normal mood and affect. Skin: Right foot - plantar surface with 2 hyperkeratotic lesions that appear to be  consistent with plantar warts.  Medications Outpatient Encounter Prescriptions as of 02/14/2017  Medication Sig  . clobetasol (TEMOVATE) 0.05 % external solution Apply 1 application topically as needed.  . desonide (DESOWEN) 0.05 % ointment Apply 1 application topically 2 (two) times daily.  Marland Kitchen losartan-hydrochlorothiazide (HYZAAR) 100-25 MG tablet Take 1 tablet by mouth daily.  . [DISCONTINUED] albuterol (PROVENTIL HFA;VENTOLIN HFA) 108 (90 Base) MCG/ACT inhaler Inhale 2 puffs into the lungs every 6 (six) hours as needed for wheezing or shortness of breath.  Marland Kitchen albuterol (PROVENTIL HFA;VENTOLIN HFA) 108 (90 Base) MCG/ACT inhaler Inhale 2 puffs into the lungs every 6 (six) hours as needed for wheezing or shortness of breath.  . [DISCONTINUED] cyclobenzaprine (FLEXERIL) 5 MG tablet 1 tablet 3 times daily as needed for back pain/spasm. (Patient not taking: Reported on 02/14/2017)   No facility-administered encounter medications on file as of 02/14/2017.      History: Past Medical History:  Diagnosis Date  . Asthma   . Chicken pox   . Headache   . Hypertension    Past Surgical History:  Procedure Laterality Date  . ABDOMINAL HYSTERECTOMY    . APPENDECTOMY    . TONSILLECTOMY      Family History  Problem Relation Age of Onset  . Stroke Mother   . Dementia Mother   . Alzheimer's disease Father   . Kidney cancer Father   . Liver cancer Father   . Stroke Father   . Heart Problems Father   . Breast cancer Sister  late 63's  . Breast cancer Maternal Aunt        3 aunts   Social History   Occupational History  . Not on file.   Social History Main Topics  . Smoking status: Never Smoker  . Smokeless tobacco: Never Used  . Alcohol use No  . Drug use: No  . Sexual activity: Not on file    Tobacco Counseling Nonsmoker.  Immunizations and Health Maintenance Immunization History  Administered Date(s) Administered  . Influenza,inj,Quad PF,36+ Mos 05/07/2016  .  Pneumococcal Conjugate-13 02/14/2017  . Tdap 02/14/2017   Health Maintenance Due  Topic Date Due  . COLONOSCOPY  05/29/2001  . DEXA SCAN  05/29/2016    Activities of Daily Living In your present state of health, do you have any difficulty performing the following activities: 02/14/2017  Hearing? N  Vision? N  Difficulty concentrating or making decisions? Y  Walking or climbing stairs? N  Dressing or bathing? N  Doing errands, shopping? N  Preparing Food and eating ? N  Using the Toilet? N  In the past six months, have you accidently leaked urine? N  Do you have problems with loss of bowel control? N  Managing your Medications? N  Managing your Finances? N  Housekeeping or managing your Housekeeping? N  Some recent data might be hidden   Advanced Directives: Does Patient Have a Medical Advance Directive?: Yes Type of Advance Directive: Healthcare Power of Attorney, Living will Does patient want to make changes to medical advance directive?: No - Patient declined    Assessment:    This is a routine wellness examination for this patient. Additional issues: Rectal bleeding, plantar warts.  Vision/Hearing screen  Visual Acuity Screening   Right eye Left eye Both eyes  Without correction: 20/25 20/20 20/20   With correction:     Hearing Screening Comments: Passed whisper tests. No current complaints/issues.  Dietary issues and exercise activities discussed:  Exercise limited by: None identified  Goals    None     Depression Screen PHQ 2/9 Scores 02/14/2017  PHQ - 2 Score 0     Fall Risk Fall Risk  02/14/2017  Falls in the past year? Yes  Number falls in past yr: 1  Injury with Fall? Yes  Follow up Falls prevention discussed    Cognitive Function:     6CIT Screen 02/14/2017  What Year? 0 points  What month? 0 points  What time? 0 points  Count back from 20 0 points  Months in reverse 0 points  Repeat phrase 0 points  Total Score 0    Patient Care  Team: Coral Spikes, DO as PCP - General (Family Medicine)     Plan:   Welcome to medicare visit.   I have personally reviewed and noted the following in the patient's chart:   . Medical and social history . Use of alcohol, tobacco or illicit drugs  . Current medications and supplements . Functional ability and status . Nutritional status . Physical activity . Advanced directives . List of other physicians . Hospitalizations, surgeries, and ER visits in previous 12 months . Vitals . Screenings to include cognitive, depression, and falls . Referrals and appointments  In addition, I have reviewed and discussed with patient certain preventive protocols, quality metrics, and best practice recommendations. A written personalized care plan for preventive services as well as general preventive health recommendations were provided to patient. Tdap and Prevnar given today. Declines Hep C and HIV screening. Ordering Dexa  scan. Labs today.   Problem List Items Addressed This Visit      Digestive   Rectal bleeding    New problem. Now resolved. Needs colonoscopy. Will arrange. CBC today.      Relevant Orders   CBC     Musculoskeletal and Integument   Plantar wart    New problem. Sending to podiatry for treatment.       Relevant Orders   Ambulatory referral to Podiatry    Other Visit Diagnoses    Encounter for Medicare annual wellness exam    -  Primary   Essential hypertension       Relevant Orders   Comprehensive metabolic panel   Pure hypercholesterolemia       Relevant Orders   Lipid panel   Need for diphtheria-tetanus-pertussis (Tdap) vaccine       Relevant Orders   Tdap vaccine greater than or equal to 7yo IM (Completed)   Need for vaccination with 13-polyvalent pneumococcal conjugate vaccine       Relevant Orders   Pneumococcal conjugate vaccine 13-valent (Completed)   Estrogen deficiency       Relevant Orders   DG Bone Density   Encounter for screening  colonoscopy       Relevant Orders   Ambulatory referral to Gastroenterology     Coral Spikes, DO 02/14/2017

## 2017-03-06 ENCOUNTER — Other Ambulatory Visit: Payer: Self-pay

## 2017-03-06 ENCOUNTER — Telehealth: Payer: Self-pay

## 2017-03-06 DIAGNOSIS — Z1211 Encounter for screening for malignant neoplasm of colon: Secondary | ICD-10-CM

## 2017-03-06 DIAGNOSIS — Z1212 Encounter for screening for malignant neoplasm of rectum: Principal | ICD-10-CM

## 2017-03-06 NOTE — Telephone Encounter (Signed)
Gastroenterology Pre-Procedure Review  Request Date: 03/29/17 Requesting Physician: Dr. Marius Ditch  PATIENT REVIEW QUESTIONS: The patient responded to the following health history questions as indicated:    *No major health issues  1. Are you having any GI issues? no 2. Do you have a personal history of Polyps? no 3. Do you have a family history of Colon Cancer or Polyps? no 4. Diabetes Mellitus? no 5. Joint replacements in the past 12 months?no 6. Major health problems in the past 3 months?no 7. Any artificial heart valves, MVP, or defibrillator?no    MEDICATIONS & ALLERGIES:    Patient reports the following regarding taking any anticoagulation/antiplatelet therapy:   Plavix, Coumadin, Eliquis, Xarelto, Lovenox, Pradaxa, Brilinta, or Effient? no Aspirin? no  Patient confirms/reports the following medications:  Current Outpatient Prescriptions  Medication Sig Dispense Refill  . albuterol (PROVENTIL HFA;VENTOLIN HFA) 108 (90 Base) MCG/ACT inhaler Inhale 2 puffs into the lungs every 6 (six) hours as needed for wheezing or shortness of breath. 1 Inhaler 0  . clobetasol (TEMOVATE) 0.05 % external solution Apply 1 application topically as needed. 50 mL 0  . desonide (DESOWEN) 0.05 % ointment Apply 1 application topically 2 (two) times daily. 60 g 0  . losartan-hydrochlorothiazide (HYZAAR) 100-25 MG tablet Take 1 tablet by mouth daily. 90 tablet 3   No current facility-administered medications for this visit.     Patient confirms/reports the following allergies:  Allergies  Allergen Reactions  . Nsaids Anaphylaxis  . Ace Inhibitors Swelling    Tongue swelling  . Aspirin Swelling  . Lisinopril Other (See Comments)  . Naproxen Other (See Comments)  . Sulfa Antibiotics Other (See Comments)    No orders of the defined types were placed in this encounter.   AUTHORIZATION INFORMATION Primary Insurance: 1D#: Group #:  Secondary Insurance: 1D#: Group #:  [id number]  INFORMATION: Date: 03/29/17 Time: Location: [place]

## 2017-03-10 ENCOUNTER — Ambulatory Visit: Payer: Self-pay | Admitting: Podiatry

## 2017-03-15 ENCOUNTER — Other Ambulatory Visit: Payer: Self-pay

## 2017-03-15 MED ORDER — PEG 3350-KCL-NA BICARB-NACL 420 G PO SOLR: 4000.0000 mL | Freq: Once | ORAL | 0 refills | Status: AC

## 2017-03-28 ENCOUNTER — Encounter: Payer: Self-pay | Admitting: *Deleted

## 2017-03-29 ENCOUNTER — Ambulatory Visit: Payer: PPO | Admitting: Anesthesiology

## 2017-03-29 ENCOUNTER — Ambulatory Visit
Admission: RE | Admit: 2017-03-29 | Discharge: 2017-03-29 | Disposition: A | Discharge status: Home / Self Care | Payer: PPO | Source: Ambulatory Visit | Attending: Gastroenterology | Admitting: Gastroenterology

## 2017-03-29 ENCOUNTER — Encounter
Admission: RE | Disposition: A | Discharge status: Home / Self Care | Payer: Self-pay | Source: Ambulatory Visit | Attending: Gastroenterology

## 2017-03-29 DIAGNOSIS — Z8051 Family history of malignant neoplasm of kidney: Secondary | ICD-10-CM | POA: Diagnosis not present

## 2017-03-29 DIAGNOSIS — K635 Polyp of colon: Secondary | ICD-10-CM | POA: Diagnosis not present

## 2017-03-29 DIAGNOSIS — Z800 Family history of malignant neoplasm of digestive organs: Secondary | ICD-10-CM | POA: Diagnosis not present

## 2017-03-29 DIAGNOSIS — Z886 Allergy status to analgesic agent status: Secondary | ICD-10-CM | POA: Diagnosis not present

## 2017-03-29 DIAGNOSIS — Z888 Allergy status to other drugs, medicaments and biological substances status: Secondary | ICD-10-CM | POA: Insufficient documentation

## 2017-03-29 DIAGNOSIS — Z8249 Family history of ischemic heart disease and other diseases of the circulatory system: Secondary | ICD-10-CM | POA: Insufficient documentation

## 2017-03-29 DIAGNOSIS — J45909 Unspecified asthma, uncomplicated: Secondary | ICD-10-CM | POA: Diagnosis not present

## 2017-03-29 DIAGNOSIS — K625 Hemorrhage of anus and rectum: Secondary | ICD-10-CM | POA: Insufficient documentation

## 2017-03-29 DIAGNOSIS — Z90710 Acquired absence of both cervix and uterus: Secondary | ICD-10-CM | POA: Insufficient documentation

## 2017-03-29 DIAGNOSIS — Z820 Family history of epilepsy and other diseases of the nervous system: Secondary | ICD-10-CM | POA: Insufficient documentation

## 2017-03-29 DIAGNOSIS — Z1212 Encounter for screening for malignant neoplasm of rectum: Secondary | ICD-10-CM

## 2017-03-29 DIAGNOSIS — Z1211 Encounter for screening for malignant neoplasm of colon: Secondary | ICD-10-CM

## 2017-03-29 DIAGNOSIS — Z823 Family history of stroke: Secondary | ICD-10-CM | POA: Insufficient documentation

## 2017-03-29 DIAGNOSIS — Z882 Allergy status to sulfonamides status: Secondary | ICD-10-CM | POA: Diagnosis not present

## 2017-03-29 DIAGNOSIS — D122 Benign neoplasm of ascending colon: Secondary | ICD-10-CM | POA: Insufficient documentation

## 2017-03-29 DIAGNOSIS — I10 Essential (primary) hypertension: Secondary | ICD-10-CM | POA: Insufficient documentation

## 2017-03-29 DIAGNOSIS — Z79899 Other long term (current) drug therapy: Secondary | ICD-10-CM | POA: Insufficient documentation

## 2017-03-29 DIAGNOSIS — Z803 Family history of malignant neoplasm of breast: Secondary | ICD-10-CM | POA: Diagnosis not present

## 2017-03-29 HISTORY — PX: COLONOSCOPY WITH PROPOFOL: SHX5780

## 2017-03-29 SURGERY — COLONOSCOPY WITH PROPOFOL
Anesthesia: General

## 2017-03-29 MED ORDER — METHYLENE BLUE 0.5 % INJ SOLN
INTRAVENOUS | Status: DC | PRN
  Administered 2017-03-29: 1 mL via SUBMUCOSAL

## 2017-03-29 MED ORDER — PROPOFOL 500 MG/50ML IV EMUL
INTRAVENOUS | Status: DC | PRN
  Administered 2017-03-29: 120 ug/kg/min via INTRAVENOUS

## 2017-03-29 MED ORDER — FENTANYL CITRATE (PF) 100 MCG/2ML IJ SOLN
INTRAMUSCULAR | Status: AC
  Filled 2017-03-29: qty 2

## 2017-03-29 MED ORDER — LIDOCAINE HCL (PF) 1 % IJ SOLN
2.0000 mL | Freq: Once | INTRAMUSCULAR | Status: AC
  Administered 2017-03-29: 0.3 mL via INTRADERMAL

## 2017-03-29 MED ORDER — ONDANSETRON HCL 4 MG/2ML IJ SOLN
INTRAMUSCULAR | Status: AC
  Filled 2017-03-29: qty 2

## 2017-03-29 MED ORDER — LIDOCAINE HCL (PF) 1 % IJ SOLN
INTRAMUSCULAR | Status: AC
  Administered 2017-03-29: 0.3 mL via INTRADERMAL
  Filled 2017-03-29: qty 2

## 2017-03-29 MED ORDER — PROPOFOL 500 MG/50ML IV EMUL
INTRAVENOUS | Status: AC
  Filled 2017-03-29: qty 50

## 2017-03-29 MED ORDER — METHYLENE BLUE 1 % INJ SOLN
INTRAMUSCULAR | Status: AC
  Filled 2017-03-29: qty 10

## 2017-03-29 MED ORDER — MIDAZOLAM HCL 2 MG/2ML IJ SOLN
INTRAMUSCULAR | Status: AC
  Filled 2017-03-29: qty 2

## 2017-03-29 MED ORDER — LIDOCAINE HCL (PF) 2 % IJ SOLN
INTRAMUSCULAR | Status: AC
  Filled 2017-03-29: qty 2

## 2017-03-29 MED ORDER — FENTANYL CITRATE (PF) 100 MCG/2ML IJ SOLN
INTRAMUSCULAR | Status: DC | PRN
  Administered 2017-03-29 (×2): 50 ug via INTRAVENOUS

## 2017-03-29 MED ORDER — PROPOFOL 10 MG/ML IV BOLUS
INTRAVENOUS | Status: DC | PRN
  Administered 2017-03-29: 30 mg via INTRAVENOUS

## 2017-03-29 MED ORDER — ONDANSETRON HCL 4 MG/2ML IJ SOLN
INTRAMUSCULAR | Status: DC | PRN
  Administered 2017-03-29: 4 mg via INTRAVENOUS

## 2017-03-29 MED ORDER — SODIUM CHLORIDE 0.9 % IV SOLN
INTRAVENOUS | Status: DC
  Administered 2017-03-29: 1000 mL via INTRAVENOUS

## 2017-03-29 NOTE — Anesthesia Preprocedure Evaluation (Signed)
Anesthesia Evaluation  Patient identified by MRN, date of birth, ID band Patient awake    Reviewed: Allergy & Precautions, NPO status , Patient's Chart, lab work & pertinent test results  Airway Mallampati: III       Dental  (+) Teeth Intact, Caps   Pulmonary asthma ,     + decreased breath sounds      Cardiovascular Exercise Tolerance: Good hypertension, Pt. on medications  Rhythm:Regular     Neuro/Psych  Headaches, Anxiety    GI/Hepatic negative GI ROS, Neg liver ROS,   Endo/Other  negative endocrine ROS  Renal/GU negative Renal ROS     Musculoskeletal   Abdominal Normal abdominal exam  (+)   Peds negative pediatric ROS (+)  Hematology negative hematology ROS (+)   Anesthesia Other Findings   Reproductive/Obstetrics                             Anesthesia Physical Anesthesia Plan  ASA: II  Anesthesia Plan: General   Post-op Pain Management:    Induction: Intravenous  PONV Risk Score and Plan: 0 and Ondansetron  Airway Management Planned: Natural Airway and Nasal Cannula  Additional Equipment:   Intra-op Plan:   Post-operative Plan:   Informed Consent: I have reviewed the patients History and Physical, chart, labs and discussed the procedure including the risks, benefits and alternatives for the proposed anesthesia with the patient or authorized representative who has indicated his/her understanding and acceptance.     Plan Discussed with: Surgeon  Anesthesia Plan Comments:         Anesthesia Quick Evaluation

## 2017-03-29 NOTE — Transfer of Care (Signed)
Immediate Anesthesia Transfer of Care Note  Patient: Summer Bishop  Procedure(s) Performed: Procedure(s): COLONOSCOPY WITH PROPOFOL (N/A)  Patient Location: PACU  Anesthesia Type:General  Level of Consciousness: awake and alert   Airway & Oxygen Therapy: Patient Spontanous Breathing and Patient connected to nasal cannula oxygen  Post-op Assessment: Report given to RN and Post -op Vital signs reviewed and stable  Post vital signs: Reviewed  Last Vitals:  Vitals:   03/29/17 0739  BP: 138/78  Pulse: 63  Resp: 17  Temp: (!) 36.1 C  SpO2: 100%    Last Pain:  Vitals:   03/29/17 0739  TempSrc: Tympanic         Complications: No apparent anesthesia complications

## 2017-03-29 NOTE — Op Note (Signed)
[facility] Gastroenterology Patient Name: Summer Bishop Procedure Date: 03/29/2017 8:19 AM MRN: [id number] Account #: [id number] Date of Birth: 1950-09-22 Admit Type: Outpatient Age: 66 Room: [facility] ENDO ROOM 4 Gender: Female Note Status: Finalized Procedure:            Colonoscopy Indications:          Screening for colorectal malignant neoplasm, Last                        colonoscopy 10 years ago Providers:            Lin Landsman MD, MD Referring MD:         Barnie Del. Lacinda Axon MD, MD (Referring MD) Medicines:            Monitored Anesthesia Care Complications:        No immediate complications. Estimated blood loss: None. Procedure:            Pre-Anesthesia Assessment:                       - Prior to the procedure, a History and Physical was                        performed, and patient medications and allergies were                        reviewed. The patient is competent. The risks and                        benefits of the procedure and the sedation options and                        risks were discussed with the patient. All questions                        were answered and informed consent was obtained.                        Patient identification and proposed procedure were                        verified by the physician, the nurse, the                        anesthesiologist, the anesthetist and the technician in                        the pre-procedure area in the procedure room. Mental                        Status Examination: alert and oriented. Airway                        Examination: normal oropharyngeal airway and neck                        mobility. Respiratory Examination: clear to                        auscultation. CV Examination: normal. Prophylactic  Antibiotics: The patient does not require prophylactic                        antibiotics. Prior Anticoagulants: The patient has                        taken no  previous anticoagulant or antiplatelet agents.                        ASA Grade Assessment: II - A patient with mild systemic                        disease. After reviewing the risks and benefits, the                        patient was deemed in satisfactory condition to undergo                        the procedure. The anesthesia plan was to use monitored                        anesthesia care (MAC). Immediately prior to                        administration of medications, the patient was                        re-assessed for adequacy to receive sedatives. The                        heart rate, respiratory rate, oxygen saturations, blood                        pressure, adequacy of pulmonary ventilation, and                        response to care were monitored throughout the                        procedure. The physical status of the patient was                        re-assessed after the procedure.                       After obtaining informed consent, the colonoscope was                        passed under direct vision. Throughout the procedure,                        the patient's blood pressure, pulse, and oxygen                        saturations were monitored continuously. The                        Colonoscope was introduced through the anus and                        advanced  to the the cecum, identified by appendiceal                        orifice and ileocecal valve. The colonoscopy was                        performed without difficulty. The patient tolerated the                        procedure well. The quality of the bowel preparation                        was evaluated using the BBPS [facility] Bowel Preparation                        Scale) with scores of: Right Colon = 3, Transverse                        Colon = 3 and Left Colon = 3 (entire mucosa seen well                        with no residual staining, small fragments of stool or                        opaque  liquid). The total BBPS score equals 9. Findings:      The perianal and digital rectal examinations were normal. Pertinent       negatives include normal sphincter tone and no palpable rectal lesions.      A 6 mm polyp was found in the ascending colon. The polyp was flat. The       polyp was removed with a lift and cut technique using a hot snare.       Resection and retrieval were complete.      The retroflexed view of the distal rectum and anal verge was normal and       showed no anal or rectal abnormalities.      The exam was otherwise without abnormality on direct and retroflexion       views. Impression:           - One 6 mm polyp in the ascending colon, removed using                        lift and cut and a hot snare. Resected and retrieved.                       - The distal rectum and anal verge are normal on                        retroflexion view.                       - The examination was otherwise normal on direct and                        retroflexion views. Recommendation:       - Discharge patient to home.                       - Resume regular diet today.                       -  Continue present medications.                       - Await pathology results.                       - Repeat colonoscopy in 5 years for surveillance. Procedure Code(s):    --- Professional ---                       [phone], Colonoscopy, flexible; with removal of tumor(s),                        polyp(s), or other lesion(s) by snare technique Diagnosis Code(s):    --- Professional ---                       Z12.11, Encounter for screening for malignant neoplasm                        of colon                       D12.2, Benign neoplasm of ascending colon CPT copyright 2016 American Medical Association. All rights reserved. The codes documented in this report are preliminary and upon coder review may  be revised to meet current compliance requirements. Dr. Ulyess Mort Lin Landsman MD,  MD 03/29/2017 9:14:30 AM This report has been signed electronically. Number of Addenda: 0 Note Initiated On: 03/29/2017 8:19 AM Scope Withdrawal Time: 0 hours 25 minutes 9 seconds  Total Procedure Duration: 0 hours 33 minutes 7 seconds       [facility]

## 2017-03-29 NOTE — H&P (Signed)
Summer Darby, MD [address]  [place]  [place], [place] [address]  Main: [phone]  Fax: [phone] Pager: [phone]  Primary Care Physician:  Coral Spikes, DO Primary Gastroenterologist:  Dr. Cephas Bishop  Pre-Procedure History & Physical: HPI:  Viriginia Bishop is a 66 y.o. female is here for an colonoscopy.   Past Medical History:  Diagnosis Date  . Asthma   . Chicken pox   . Headache   . Hypertension     Past Surgical History:  Procedure Laterality Date  . ABDOMINAL HYSTERECTOMY    . APPENDECTOMY    . TONSILLECTOMY      Prior to Admission medications   Medication Sig Start Date End Date Taking? Authorizing Provider  albuterol (PROVENTIL HFA;VENTOLIN HFA) 108 (90 Base) MCG/ACT inhaler Inhale 2 puffs into the lungs every 6 (six) hours as needed for wheezing or shortness of breath. 02/14/17   Coral Spikes, DO  Calcium Carbonate-Vitamin D 600-400 MG-UNIT tablet Take by mouth.    [provider]  clobetasol (TEMOVATE) 0.05 % external solution Apply 1 application topically as needed. 02/11/16   Coral Spikes, DO  Dermatological Products, Misc. (HYLATOPIC PLUS) CREA  09/15/13   [provider]  desonide (DESOWEN) 0.05 % ointment Apply 1 application topically 2 (two) times daily. 02/11/16   Coral Spikes, DO  losartan-hydrochlorothiazide (HYZAAR) 100-25 MG tablet Take 1 tablet by mouth daily. 02/11/16   Coral Spikes, DO  Multiple Vitamin (MULTI-VITAMINS) TABS Take by mouth.    [provider]  urea (X-VIATE) 40 % CREA  09/15/13   [provider]    Allergies as of 03/06/2017 - Review Complete 02/14/2017  Allergen Reaction Noted  . Nsaids Anaphylaxis 02/11/2016  . Ace inhibitors Swelling 02/11/2016  . Aspirin Swelling 02/11/2016  . Lisinopril Other (See Comments) 02/11/2016  . Naproxen Other (See Comments) 02/11/2016  . Sulfa antibiotics Other (See Comments) 02/11/2016    Family History  Problem Relation Age of Onset  .  Stroke Mother   . Dementia Mother   . Alzheimer's disease Father   . Kidney cancer Father   . Liver cancer Father   . Stroke Father   . Heart Problems Father   . Breast cancer Sister        late 75's  . Breast cancer Maternal Aunt        3 aunts    Social History   Social History  . Marital status: Married    Spouse name: N/A  . Number of children: N/A  . Years of education: N/A   Occupational History  . Not on file.   Social History Main Topics  . Smoking status: Never Smoker  . Smokeless tobacco: Never Used  . Alcohol use No  . Drug use: No  . Sexual activity: Not on file   Other Topics Concern  . Not on file   Social History Narrative  . No narrative on file    Review of Systems: See HPI, otherwise negative ROS  Physical Exam: BP 138/78   Pulse 63   Temp (!) 96.9 F (36.1 C) (Tympanic)   Resp 17   Ht 5\' 2"  (1.575 m)   Wt 138 lb (62.6 kg)   SpO2 100%   BMI 25.24 kg/m  General:   Alert,  pleasant and cooperative in NAD Head:  Normocephalic and atraumatic. Neck:  Supple; no masses or thyromegaly. Lungs:  Clear throughout to auscultation.    Heart:  Regular  rate and rhythm. Abdomen:  Soft, nontender and nondistended. Normal bowel sounds, without guarding, and without rebound.   Neurologic:  Alert and  oriented x4;  grossly normal neurologically.  Impression/Plan: Summer Bishop is here for an colonoscopy to be performed for rectal bleeding  Risks, benefits, limitations, and alternatives regarding  colonoscopy have been reviewed with the patient.  Questions have been answered.  All parties agreeable.   Sherri Sear, MD  03/29/2017, 7:47 AM

## 2017-03-29 NOTE — Anesthesia Post-op Follow-up Note (Signed)
Anesthesia QCDR form completed.        

## 2017-03-30 ENCOUNTER — Encounter: Payer: Self-pay | Admitting: Gastroenterology

## 2017-03-30 LAB — SURGICAL PATHOLOGY

## 2017-03-30 NOTE — Anesthesia Postprocedure Evaluation (Signed)
Anesthesia Post Note  Patient: Summer Bishop  Procedure(s) Performed: Procedure(s) (LRB): COLONOSCOPY WITH PROPOFOL (N/A)  Patient location during evaluation: PACU Anesthesia Type: General Level of consciousness: awake Pain management: pain level controlled Vital Signs Assessment: post-procedure vital signs reviewed and stable Respiratory status: spontaneous breathing Cardiovascular status: stable Anesthetic complications: no     Last Vitals:  Vitals:   03/29/17 0933 03/29/17 0943  BP: 115/76 119/73  Pulse: 64 63  Resp: 15 12  Temp:    SpO2: 100% 99%    Last Pain:  Vitals:   03/29/17 0913  TempSrc: Tympanic                 VAN STAVEREN,Clora Ohmer

## 2017-03-31 ENCOUNTER — Encounter: Payer: Self-pay | Admitting: Gastroenterology

## 2017-03-31 ENCOUNTER — Ambulatory Visit (INDEPENDENT_AMBULATORY_CARE_PROVIDER_SITE_OTHER): Payer: PPO | Admitting: Podiatry

## 2017-03-31 DIAGNOSIS — Q828 Other specified congenital malformations of skin: Secondary | ICD-10-CM | POA: Diagnosis not present

## 2017-04-04 NOTE — Progress Notes (Signed)
   Subjective: Patient is a 66 year old female who presents to the office today as a new patient for chief complaint of painful callus lesions of the bilateral forefeet and right arch. Patient states that the pain is ongoing and is affecting their ability to ambulate without pain. Patient presents today for further treatment and evaluation.  Objective:  Physical Exam General: Alert and oriented x3 in no acute distress  Dermatology: Hyperkeratotic lesion present on the bilateral feet x 5. Pain on palpation with a central nucleated core noted.  Skin is warm, dry and supple bilateral lower extremities. Negative for open lesions or macerations.  Vascular: Palpable pedal pulses bilaterally. No edema or erythema noted. Capillary refill within normal limits.  Neurological: Epicritic and protective threshold grossly intact bilaterally.   Musculoskeletal Exam: Pain on palpation at the keratotic lesion noted. Range of motion within normal limits bilateral. Muscle strength 5/5 in all groups bilateral.  Assessment: #1 Porokeratosis bilaterally x 5   Plan of Care:  #1 Patient evaluated #2 Excisional debridement of keratoic lesion using a chisel blade was performed without incident.  #3 Treated area(s) with Salinocaine and dressed with light dressing. #4 Patient is to return to the clinic PRN.   Edrick Kins, DPM [facility]  Dr. Edrick Kins, [place]                                        [place], [place] [address]                Office [phone]  Fax [phone]

## 2017-04-18 ENCOUNTER — Other Ambulatory Visit: Payer: Self-pay | Admitting: Family Medicine

## 2017-04-20 ENCOUNTER — Ambulatory Visit
Admission: RE | Admit: 2017-04-20 | Discharge: 2017-04-20 | Disposition: A | Discharge status: Home / Self Care | Payer: PPO | Source: Ambulatory Visit | Attending: Family Medicine | Admitting: Family Medicine

## 2017-04-20 DIAGNOSIS — M810 Age-related osteoporosis without current pathological fracture: Secondary | ICD-10-CM | POA: Diagnosis not present

## 2017-04-20 DIAGNOSIS — M8589 Other specified disorders of bone density and structure, multiple sites: Secondary | ICD-10-CM | POA: Diagnosis not present

## 2017-04-20 DIAGNOSIS — Z13820 Encounter for screening for osteoporosis: Secondary | ICD-10-CM | POA: Diagnosis not present

## 2017-04-20 DIAGNOSIS — E2839 Other primary ovarian failure: Secondary | ICD-10-CM | POA: Insufficient documentation

## 2017-04-25 ENCOUNTER — Other Ambulatory Visit: Payer: Self-pay | Admitting: Family Medicine

## 2017-04-25 MED ORDER — ALENDRONATE SODIUM 70 MG PO TABS: 70.0000 mg | ORAL_TABLET | ORAL | 11 refills | Status: AC

## 2017-05-04 DIAGNOSIS — Z23 Encounter for immunization: Secondary | ICD-10-CM | POA: Diagnosis not present

## 2017-05-04 DIAGNOSIS — E7800 Pure hypercholesterolemia, unspecified: Secondary | ICD-10-CM | POA: Insufficient documentation

## 2017-05-04 DIAGNOSIS — M810 Age-related osteoporosis without current pathological fracture: Secondary | ICD-10-CM | POA: Insufficient documentation

## 2017-05-04 DIAGNOSIS — I10 Essential (primary) hypertension: Secondary | ICD-10-CM | POA: Insufficient documentation

## 2017-05-04 DIAGNOSIS — J45990 Exercise induced bronchospasm: Secondary | ICD-10-CM | POA: Diagnosis not present

## 2017-05-26 ENCOUNTER — Telehealth: Payer: Self-pay

## 2017-05-26 NOTE — Telephone Encounter (Signed)
It looks like there was a referral to podiatry. They are wanting proof.   Copied from [place] #[id number]. Topic: Referral - Question >> May 26, 2017  4:03 PM Summer Bishop, NT wrote: Reason for CRM: pt states Dr. Lacinda Axon was her doctor and he referred her to see Dr. Esperanza Sheets @ [facility] . She received a bill in the mail from them, and pt states the reason they billed her was because they did not receive a referral paper from Dr. Lacinda Axon. They told her if the office sent over something stating Dr. Lacinda Axon referred her, then it would be paid for. Please contact patient if needed. Thank you.

## 2017-05-26 NOTE — Telephone Encounter (Signed)
It looks like there is a referral under the referrals tab from Dr. Lacinda Axon. You could print this out and send it to them.

## 2017-06-01 NOTE — Telephone Encounter (Signed)
Referral faxed to [facility] per patients request

## 2017-06-20 DIAGNOSIS — R0789 Other chest pain: Secondary | ICD-10-CM | POA: Diagnosis not present

## 2017-06-20 DIAGNOSIS — R071 Chest pain on breathing: Secondary | ICD-10-CM | POA: Diagnosis not present

## 2017-06-20 DIAGNOSIS — R079 Chest pain, unspecified: Secondary | ICD-10-CM | POA: Diagnosis not present

## 2017-07-31 DIAGNOSIS — E7800 Pure hypercholesterolemia, unspecified: Secondary | ICD-10-CM | POA: Diagnosis not present

## 2017-07-31 DIAGNOSIS — I10 Essential (primary) hypertension: Secondary | ICD-10-CM | POA: Diagnosis not present

## 2017-08-07 ENCOUNTER — Other Ambulatory Visit: Payer: Self-pay | Admitting: Internal Medicine

## 2017-08-07 DIAGNOSIS — M810 Age-related osteoporosis without current pathological fracture: Secondary | ICD-10-CM | POA: Diagnosis not present

## 2017-08-07 DIAGNOSIS — Z0000 Encounter for general adult medical examination without abnormal findings: Secondary | ICD-10-CM | POA: Diagnosis not present

## 2017-08-07 DIAGNOSIS — Z1231 Encounter for screening mammogram for malignant neoplasm of breast: Secondary | ICD-10-CM | POA: Diagnosis not present

## 2017-08-07 DIAGNOSIS — Z1239 Encounter for other screening for malignant neoplasm of breast: Secondary | ICD-10-CM

## 2017-09-04 ENCOUNTER — Ambulatory Visit
Admission: RE | Admit: 2017-09-04 | Discharge: 2017-09-04 | Disposition: A | Discharge status: Home / Self Care | Payer: PPO | Source: Ambulatory Visit | Attending: Internal Medicine | Admitting: Internal Medicine

## 2017-09-04 DIAGNOSIS — Z1239 Encounter for other screening for malignant neoplasm of breast: Secondary | ICD-10-CM

## 2017-09-04 DIAGNOSIS — Z1231 Encounter for screening mammogram for malignant neoplasm of breast: Secondary | ICD-10-CM | POA: Insufficient documentation

## 2017-12-17 DIAGNOSIS — I10 Essential (primary) hypertension: Secondary | ICD-10-CM | POA: Diagnosis not present

## 2017-12-17 DIAGNOSIS — T50905A Adverse effect of unspecified drugs, medicaments and biological substances, initial encounter: Secondary | ICD-10-CM | POA: Diagnosis not present

## 2018-01-17 IMAGING — MG MM DIGITAL SCREENING BILAT W/ CAD
6 series · 6 of 6 positions shown · non-contrast
Comparison: Previous exam(s).

CLINICAL DATA: Screening.

EXAM:
DIGITAL SCREENING BILATERAL MAMMOGRAM WITH CAD

[R MLO]
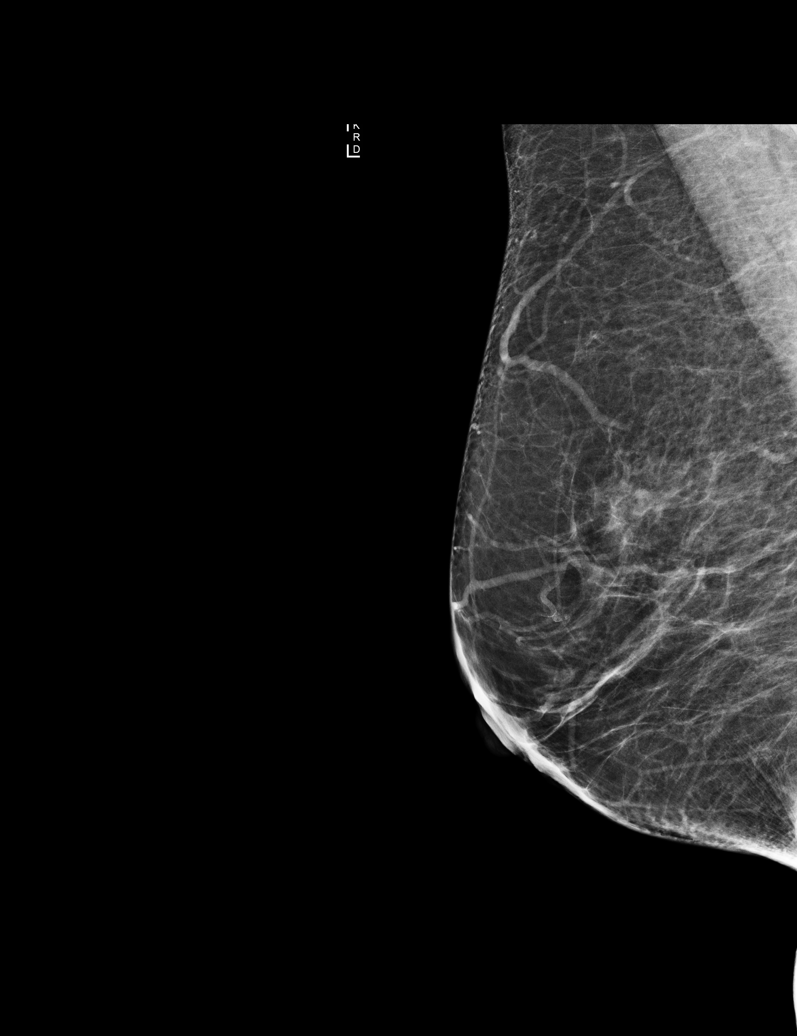

[R CC]
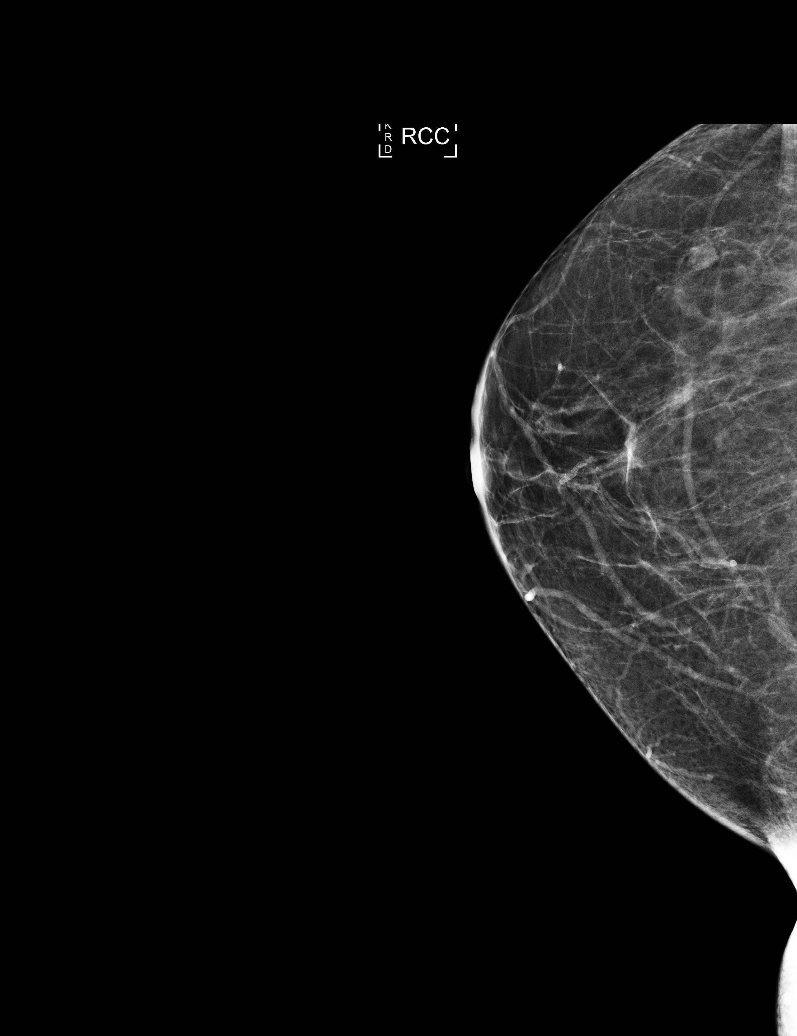

[L MLO (1 of 2)]
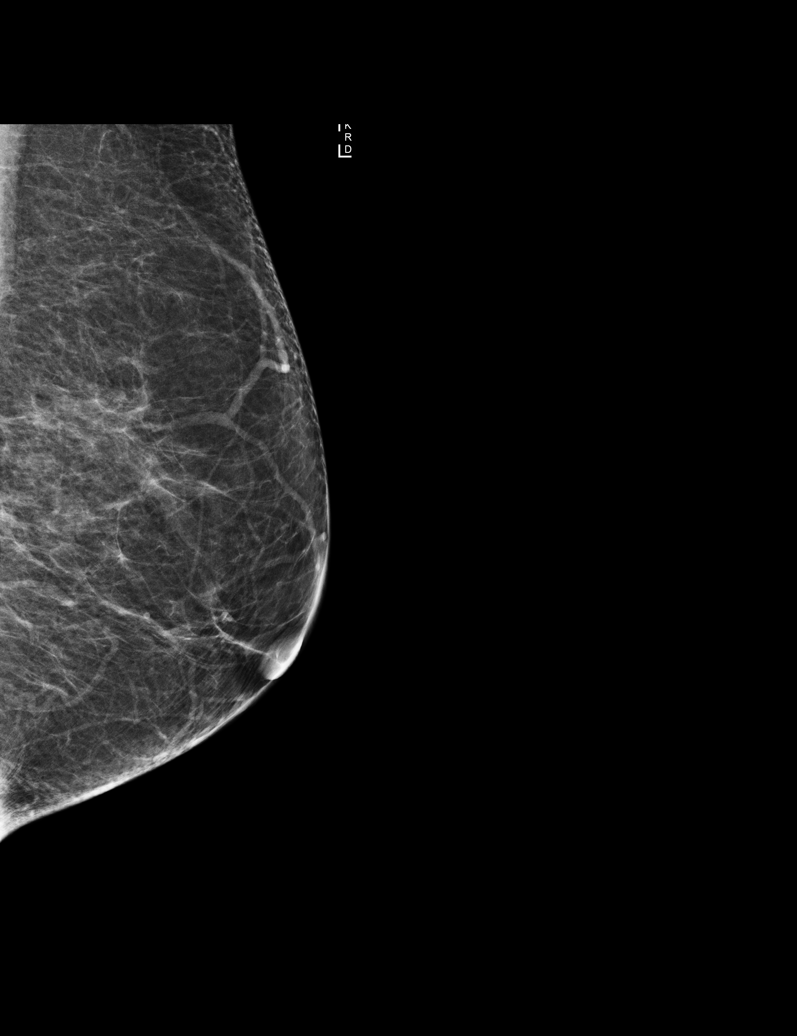

[L CC]
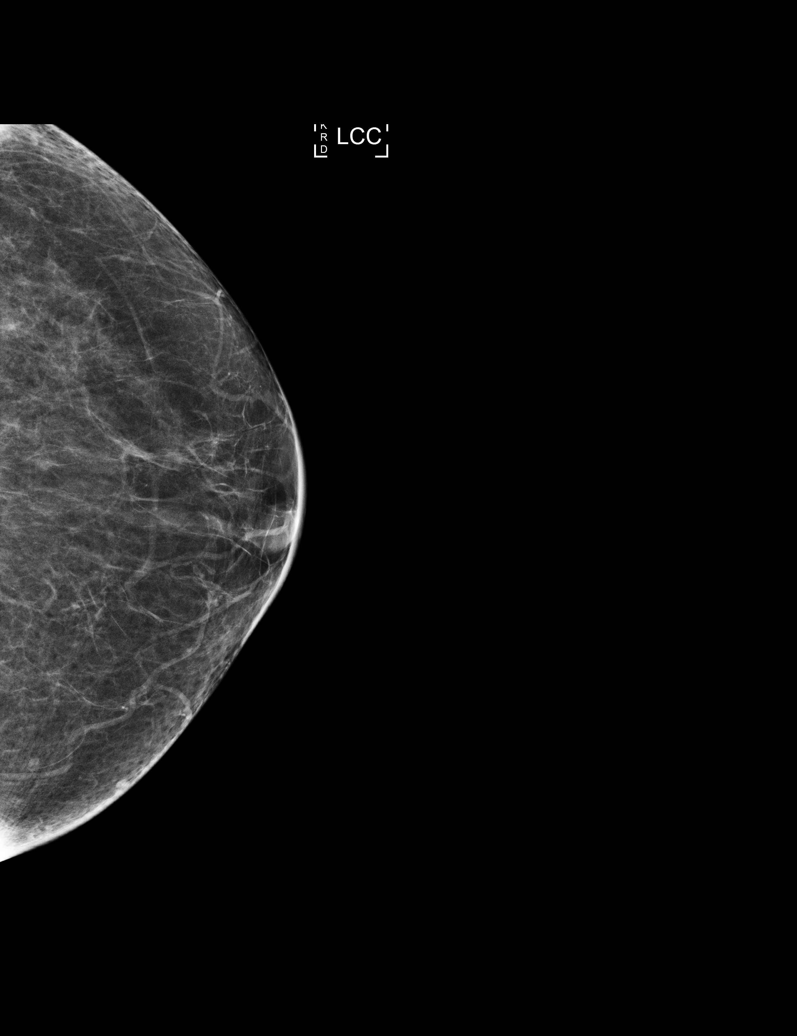

[R XCCL]
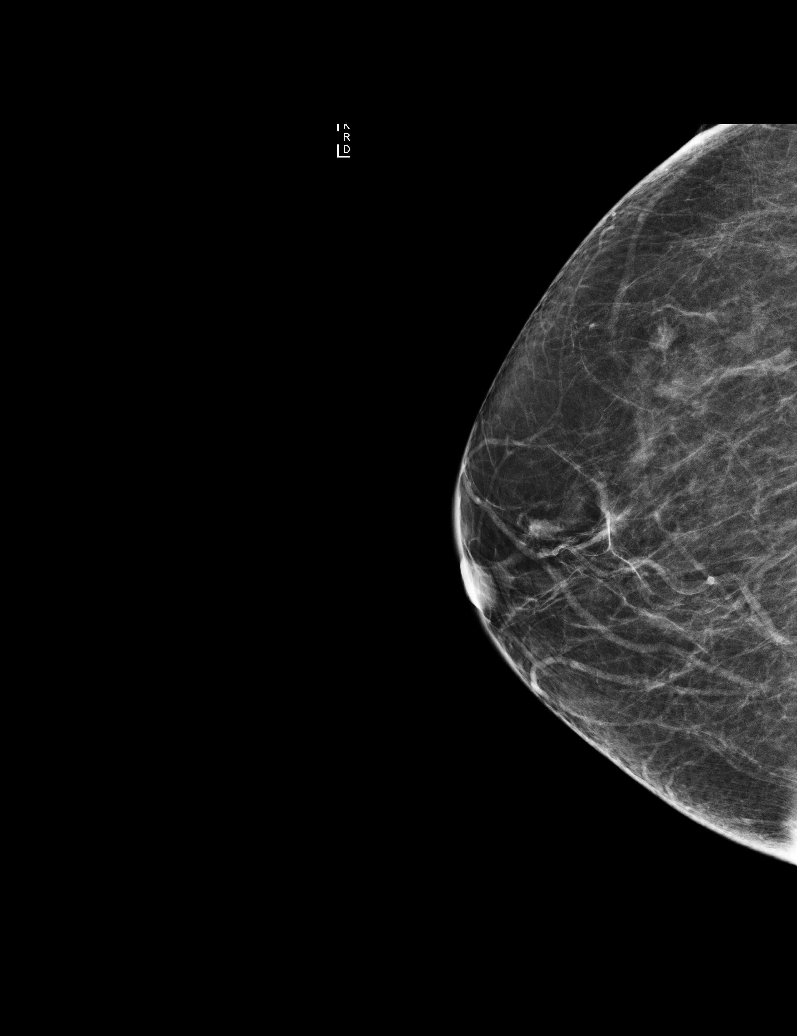

[L MLO (2 of 2)]
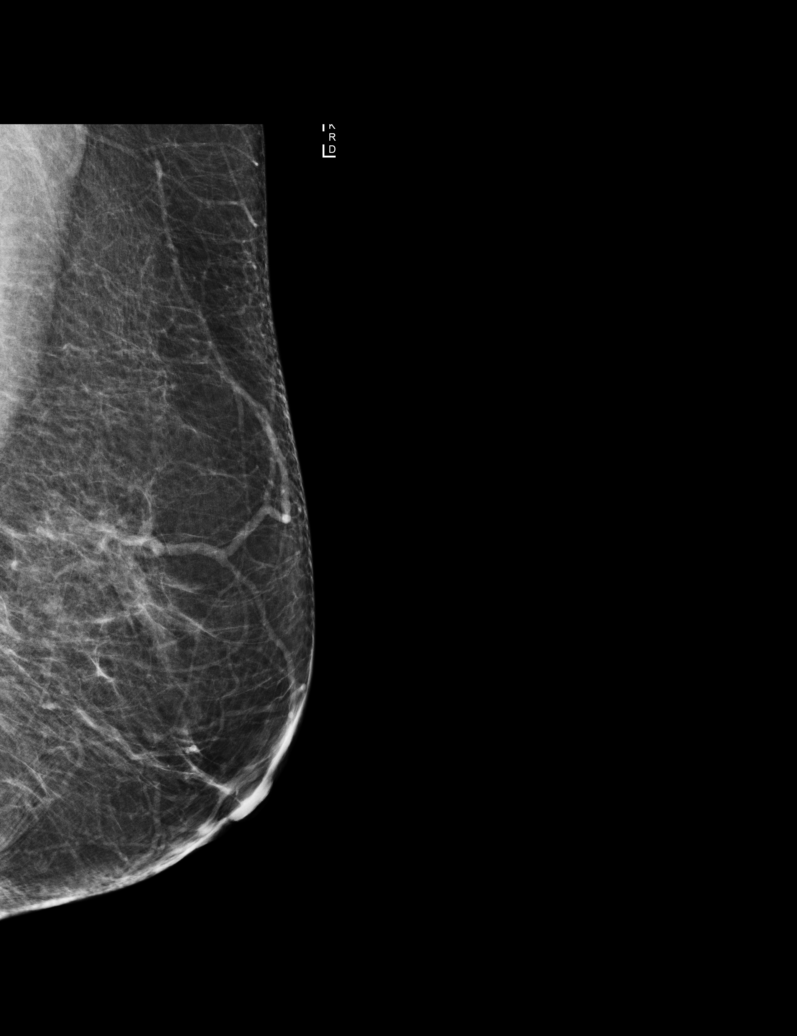

[6 of 6 positions shown; findings below may reference images not displayed]

ACR Breast Density Category b: There are scattered areas of
fibroglandular density.
FINDINGS: There are no findings suspicious for malignancy. Images were
processed with CAD.
IMPRESSION: No mammographic evidence of malignancy. A result letter of this
screening mammogram will be mailed directly to the patient.

RECOMMENDATION:
Screening mammogram in one year. (Code:AS-G-LCT)

BI-RADS CATEGORY  1: Negative.

## 2018-02-05 DIAGNOSIS — I10 Essential (primary) hypertension: Secondary | ICD-10-CM | POA: Diagnosis not present

## 2018-02-05 DIAGNOSIS — I83813 Varicose veins of bilateral lower extremities with pain: Secondary | ICD-10-CM | POA: Diagnosis not present

## 2018-02-09 ENCOUNTER — Encounter (INDEPENDENT_AMBULATORY_CARE_PROVIDER_SITE_OTHER): Payer: Self-pay | Admitting: Vascular Surgery

## 2018-02-09 ENCOUNTER — Ambulatory Visit (INDEPENDENT_AMBULATORY_CARE_PROVIDER_SITE_OTHER): Payer: PPO | Admitting: Vascular Surgery

## 2018-02-09 VITALS — BP 140/80 | HR 68 | Resp 18 | Ht 62.0 in | Wt 150.0 lb

## 2018-02-09 DIAGNOSIS — M25562 Pain in left knee: Secondary | ICD-10-CM

## 2018-02-09 DIAGNOSIS — I10 Essential (primary) hypertension: Secondary | ICD-10-CM | POA: Diagnosis not present

## 2018-02-09 DIAGNOSIS — M25569 Pain in unspecified knee: Secondary | ICD-10-CM | POA: Insufficient documentation

## 2018-02-09 NOTE — Patient Instructions (Signed)

## 2018-02-09 NOTE — Assessment & Plan Note (Signed)
blood pressure control important in reducing the progression of atherosclerotic disease. On appropriate oral medications.  

## 2018-02-09 NOTE — Progress Notes (Signed)
Patient ID: Summer Bishop, female   DOB: 1951-04-20, 67 y.o.   MRN: [id number]  Chief Complaint  Patient presents with  . New Patient (Initial Visit)    ref Candiss Norse varicose veins    HPI Summer Bishop is a 67 y.o. female.  I am asked to see the patient by Dr. Candiss Norse for evaluation of leg pain and varicose veins. The patient reports a long standing history of varicosities and they have become painful over time. There was no clear inciting event or causative factor that started the symptoms.  The left leg is more severly affected. The patient elevates the legs for relief. The pain is described as stinging and stabbing pain starting in her left thigh and radiating down her leg.  Her husband has a brain abscess and she has been up on her feet more having to take care of him and out of her normal routine.  She really does not have any right leg symptoms. The symptoms are generally most severe in the evening, particularly when they have been on their feet for long periods of time.  She has ordered a compression stocking but it has not yet come in.  The patient complains of left leg swelling as an associated symptom. The patient has no previous history of deep venous thrombosis or superficial thrombophlebitis to their knowledge.     Past Medical History:  Diagnosis Date  . Asthma   . Chicken pox   . Headache   . Hypertension     Past Surgical History:  Procedure Laterality Date  . ABDOMINAL HYSTERECTOMY    . APPENDECTOMY    . COLONOSCOPY WITH PROPOFOL N/A 03/29/2017   Procedure: COLONOSCOPY WITH PROPOFOL;  Surgeon: Lin Landsman, MD;  Location: [facility] ENDOSCOPY;  Service: Gastroenterology;  Laterality: N/A;  . TONSILLECTOMY      Family History  Problem Relation Age of Onset  . Stroke Mother   . Dementia Mother   . Alzheimer's disease Father   . Kidney cancer Father   . Liver cancer Father   . Stroke Father   . Heart Problems Father   . Breast cancer Sister        late 54's  .  Breast cancer Maternal Aunt        3 aunts    Social History Social History   Tobacco Use  . Smoking status: Never Smoker  . Smokeless tobacco: Never Used  Substance Use Topics  . Alcohol use: No  . Drug use: No    Allergies  Allergen Reactions  . Nsaids Anaphylaxis  . Ace Inhibitors Swelling    Tongue swelling  . Aspirin Swelling  . Lisinopril Other (See Comments)  . Naproxen Other (See Comments)  . Sulfa Antibiotics Other (See Comments)    Current Outpatient Medications  Medication Sig Dispense Refill  . albuterol (PROVENTIL HFA;VENTOLIN HFA) 108 (90 Base) MCG/ACT inhaler Inhale 2 puffs into the lungs every 6 (six) hours as needed for wheezing or shortness of breath. 1 Inhaler 0  . alendronate (FOSAMAX) 70 MG tablet Take 1 tablet (70 mg total) by mouth every 7 (seven) days. Take with a full glass of water on an empty stomach. 4 tablet 11  . amLODipine (NORVASC) 5 MG tablet TAKE 1 TABLET BY MOUTH ONCE DAILY*STOP HYZAAR FOR RECALL & ALLERGIC TO LISINOPRIL/ACE INHIBITORS*  1  . Calcium Carbonate-Vitamin D 600-400 MG-UNIT tablet Take by mouth.    . DiphenhydrAMINE HCl (BENADRYL ALLERGY PO) Take by mouth.    Marland Kitchen  hydrochlorothiazide (HYDRODIURIL) 25 MG tablet Take by mouth.    . Multiple Vitamin (MULTI-VITAMINS) TABS Take by mouth.    . Dermatological Products, Misc. (HYLATOPIC PLUS) CREA     . losartan-hydrochlorothiazide (HYZAAR) 100-25 MG tablet TAKE 1 TABLET BY MOUTH DAILY. (Patient not taking: Reported on 02/09/2018) 90 tablet 3  . polyethylene glycol-electrolytes (NULYTELY/GOLYTELY) 420 g solution START AT 5:00PM DAY BEFORE PROCEDURE DRINK 1 EIGHT OZ GLASS EVERY 20-30 MINS UNTIL STOOLS ARE CLEAR  0   No current facility-administered medications for this visit.       REVIEW OF SYSTEMS (Negative unless checked)  Constitutional: [] Weight loss  [] Fever  [] Chills Cardiac: [] Chest pain   [] Chest pressure   [] Palpitations   [] Shortness of breath when laying flat   [] Shortness  of breath at rest   [] Shortness of breath with exertion. Vascular:  [] Pain in legs with walking   [] Pain in legs at rest   [] Pain in legs when laying flat   [] Claudication   [] Pain in feet when walking  [] Pain in feet at rest  [] Pain in feet when laying flat   [] History of DVT   [] Phlebitis   [x] Swelling in legs   [x] Varicose veins   [] Non-healing ulcers Pulmonary:   [] Uses home oxygen   [] Productive cough   [] Hemoptysis   [] Wheeze  [] COPD   [] Asthma Neurologic:  [] Dizziness  [] Blackouts   [] Seizures   [] History of stroke   [] History of TIA  [] Aphasia   [] Temporary blindness   [] Dysphagia   [] Weakness or numbness in arms   [] Weakness or numbness in legs Musculoskeletal:  [] Arthritis   [] Joint swelling   [] Joint pain   [] Low back pain Hematologic:  [] Easy bruising  [] Easy bleeding   [] Hypercoagulable state   [x] Anemic  [] Hepatitis Gastrointestinal:  [] Blood in stool   [] Vomiting blood  [x] Gastroesophageal reflux/heartburn   [] Abdominal pain Genitourinary:  [] Chronic kidney disease   [] Difficult urination  [] Frequent urination  [] Burning with urination   [] Hematuria Skin:  [] Rashes   [] Ulcers   [] Wounds Psychological:  [x] History of anxiety   []  History of major depression.    Physical Exam BP 140/80 (BP Location: Right Arm)   Pulse 68   Resp 18   Ht 5\' 2"  (1.575 m)   Wt 150 lb (68 kg)   BMI 27.44 kg/m  Gen:  WD/WN, NAD Head: [place]/AT, No temporalis wasting.  Ear/Nose/Throat: Hearing grossly intact, dentition good Eyes: Sclera non-icteric. Conjunctiva clear Neck: Supple. Trachea midline Pulmonary:  Good air movement, no use of accessory muscles, respirations not labored.  Cardiac: RRR, No JVD Vascular: Varicosities scattered and measuring up to 1-2 mm in the right lower extremity        Varicosities diffuse and measuring up to 2 mm in the left lower extremity Vessel Right Left  Radial Palpable Palpable                          PT Palpable Palpable  DP Palpable Palpable    Gastrointestinal: soft, non-tender/non-distended.  Musculoskeletal: M/S 5/5 throughout.   No RLE edema.  Trace LLE edema Neurologic: Sensation grossly intact in extremities.  Symmetrical.  Speech is fluent.  Psychiatric: Judgment intact, Mood & affect appropriate for pt's clinical situation. Dermatologic: No rashes or ulcers noted.  No cellulitis or open wounds.    Radiology No results found.  Labs No results found for this or any previous visit (from the past 2160 hour(s)).  Assessment/Plan:  Benign essential HTN  blood pressure control important in reducing the progression of atherosclerotic disease. On appropriate oral medications.   Pain in joint, lower leg   The patient has worsening left leg pain and swelling.  Although her symptoms are not classic for venous insufficiency, that could certainly be a contributing factor. We discussed the natural history and treatment options for venous disease. I recommended the regular use of 20 - 30 mm Hg compression stockings, and prescribed these today. I recommended leg elevation and anti-inflammatories as needed for pain. I have also recommended a complete venous duplex to assess the venous system for reflux or thrombotic issues. This can be done at the patient's convenience. I will see the patient back after their duplex to assess the response to conservative management, and determine further treatment options.     Leotis Pain 02/09/2018, 3:36 PM   This note was created with Dragon medical transcription system.  Any errors from dictation are unintentional.

## 2018-03-21 ENCOUNTER — Ambulatory Visit (INDEPENDENT_AMBULATORY_CARE_PROVIDER_SITE_OTHER): Payer: PPO

## 2018-03-21 ENCOUNTER — Encounter (INDEPENDENT_AMBULATORY_CARE_PROVIDER_SITE_OTHER): Payer: Self-pay | Admitting: Vascular Surgery

## 2018-03-21 ENCOUNTER — Ambulatory Visit (INDEPENDENT_AMBULATORY_CARE_PROVIDER_SITE_OTHER): Payer: PPO | Admitting: Vascular Surgery

## 2018-03-21 VITALS — BP 154/84 | HR 73 | Resp 14 | Ht 62.0 in | Wt 152.0 lb

## 2018-03-21 DIAGNOSIS — M25562 Pain in left knee: Secondary | ICD-10-CM | POA: Diagnosis not present

## 2018-03-21 DIAGNOSIS — I890 Lymphedema, not elsewhere classified: Secondary | ICD-10-CM | POA: Diagnosis not present

## 2018-03-21 DIAGNOSIS — I10 Essential (primary) hypertension: Secondary | ICD-10-CM

## 2018-03-21 NOTE — Progress Notes (Signed)
Subjective:    Patient ID: Summer Bishop, female    DOB: 1950/12/20, 67 y.o.   MRN: [id number] Chief Complaint  Patient presents with  . Follow-up    Reflux u/s follow up   Patient presents to review vascular studies.  Patient was last seen on February 09, 2018 for evaluation of left lower extremity pain and swelling.  Since her initial visit, the patient has been engaging in conservative therapy including wearing thigh-high medical grade 1 compression socks, elevating her legs and remaining active with minimal improvement in her symptoms.  The patient still experiences a significant amount of swelling and that swelling is accompanied by discomfort.  The patient feels that her discomfort has progressed to the point that she is unable to function on a daily basis and her symptoms have become lifestyle limiting.  The patient denies any ulcer formation to the bilateral lower extremity.  The patient underwent a bilateral lower extremity venous duplex which was notable for no evidence of deep vein thrombosis, no evidence of chronic venous insufficiency, and no evidence of superficial venous thrombophlebitis in the bilateral lower extremity. The patient denies any fever, nausea vomiting.  Review of Systems  Constitutional: Negative.   HENT: Negative.   Eyes: Negative.   Respiratory: Negative.   Cardiovascular: Positive for leg swelling.       Lower Extremity Pain  Gastrointestinal: Negative.   Endocrine: Negative.   Genitourinary: Negative.   Musculoskeletal: Negative.   Skin: Negative.   Allergic/Immunologic: Negative.   Neurological: Negative.   Hematological: Negative.   Psychiatric/Behavioral: Negative.       Objective:   Physical Exam  Constitutional: She is oriented to person, place, and time. She appears well-developed and well-nourished. No distress.  HENT:  Head: Normocephalic and atraumatic.  Right Ear: External ear normal.  Left Ear: External ear normal.  Eyes: Pupils are equal,  round, and reactive to light. Conjunctivae and EOM are normal.  Neck: Normal range of motion.  Cardiovascular: Normal rate, regular rhythm, normal heart sounds and intact distal pulses.  Pulses:      Radial pulses are 2+ on the right side, and 2+ on the left side.  Pulmonary/Chest: Effort normal and breath sounds normal.  Musculoskeletal: Normal range of motion. She exhibits edema (Moderate nonpitting edema noted to the left lower extremity).  Neurological: She is alert and oriented to person, place, and time.  Skin: Skin is warm and dry. She is not diaphoretic.  There is no stasis dermatitis, fibrosis, cellulitis or active ulcerations noted at this time  Psychiatric: She has a normal mood and affect. Her behavior is normal. Judgment and thought content normal.  Vitals reviewed.  BP (!) 154/84 (BP Location: Right Arm, Patient Position: Sitting)   Pulse 73   Resp 14   Ht 5\' 2"  (1.575 m)   Wt 152 lb (68.9 kg)   BMI 27.80 kg/m   Past Medical History:  Diagnosis Date  . Asthma   . Chicken pox   . Headache   . Hypertension    Social History   Socioeconomic History  . Marital status: Married    Spouse name: Not on file  . Number of children: Not on file  . Years of education: Not on file  . Highest education level: Not on file  Occupational History  . Not on file  Social Needs  . Financial resource strain: Not on file  . Food insecurity:    Worry: Not on file    Inability:  Not on file  . Transportation needs:    Medical: Not on file    Non-medical: Not on file  Tobacco Use  . Smoking status: Never Smoker  . Smokeless tobacco: Never Used  Substance and Sexual Activity  . Alcohol use: No  . Drug use: No  . Sexual activity: Not on file  Lifestyle  . Physical activity:    Days per week: Not on file    Minutes per session: Not on file  . Stress: Not on file  Relationships  . Social connections:    Talks on phone: Not on file    Gets together: Not on file    Attends  religious service: Not on file    Active member of club or organization: Not on file    Attends meetings of clubs or organizations: Not on file    Relationship status: Not on file  . Intimate partner violence:    Fear of current or ex partner: Not on file    Emotionally abused: Not on file    Physically abused: Not on file    Forced sexual activity: Not on file  Other Topics Concern  . Not on file  Social History Narrative  . Not on file   Past Surgical History:  Procedure Laterality Date  . ABDOMINAL HYSTERECTOMY    . APPENDECTOMY    . COLONOSCOPY WITH PROPOFOL N/A 03/29/2017   Procedure: COLONOSCOPY WITH PROPOFOL;  Surgeon: Lin Landsman, MD;  Location: [facility] ENDOSCOPY;  Service: Gastroenterology;  Laterality: N/A;  . TONSILLECTOMY     Family History  Problem Relation Age of Onset  . Stroke Mother   . Dementia Mother   . Alzheimer's disease Father   . Kidney cancer Father   . Liver cancer Father   . Stroke Father   . Heart Problems Father   . Breast cancer Sister        late 65's  . Breast cancer Maternal Aunt        3 aunts   Allergies  Allergen Reactions  . Nsaids Anaphylaxis  . Ace Inhibitors Swelling    Tongue swelling  . Aspirin Swelling  . Lisinopril Other (See Comments)  . Naproxen Other (See Comments)  . Sulfa Antibiotics Other (See Comments)      Assessment & Plan:  Patient presents to review vascular studies.  Patient was last seen on February 09, 2018 for evaluation of left lower extremity pain and swelling.  Since her initial visit, the patient has been engaging in conservative therapy including wearing thigh-high medical grade 1 compression socks, elevating her legs and remaining active with minimal improvement in her symptoms.  The patient still experiences a significant amount of swelling and that swelling is accompanied by discomfort.  The patient feels that her discomfort has progressed to the point that she is unable to function on a daily basis and  her symptoms have become lifestyle limiting.  The patient denies any ulcer formation to the bilateral lower extremity.  The patient underwent a bilateral lower extremity venous duplex which was notable for no evidence of deep vein thrombosis, no evidence of chronic venous insufficiency, and no evidence of superficial venous thrombophlebitis in the bilateral lower extremity. The patient denies any fever, nausea vomiting.  1. Lymphedema - Stable Despite conservative treatments including exercise elevation and class I compression socks the patient still presents with stage I lymphedema. The patient would greatly benefit from the added therapy of a lymphedema pump The patient's symptoms have progressed  to the point that she is unable to function on a daily basis and they have become lifestyle limiting Patient does not have any superficial chronic venous insufficiency and there forth is not a candidate for any type of endovenous laser ablation We will applied to the patient's insurance She is in agreement The patient is to follow-up in 4 months so I can assess her progress with conservative therapy and the addition of lymphedema pump  2. Benign essential HTN - Stable Encouraged good control as its slows the progression of atherosclerotic disease  Current Outpatient Medications on File Prior to Visit  Medication Sig Dispense Refill  . albuterol (PROVENTIL HFA;VENTOLIN HFA) 108 (90 Base) MCG/ACT inhaler Inhale 2 puffs into the lungs every 6 (six) hours as needed for wheezing or shortness of breath. 1 Inhaler 0  . alendronate (FOSAMAX) 70 MG tablet Take 1 tablet (70 mg total) by mouth every 7 (seven) days. Take with a full glass of water on an empty stomach. 4 tablet 11  . amLODipine (NORVASC) 5 MG tablet TAKE 1 TABLET BY MOUTH ONCE DAILY*STOP HYZAAR FOR RECALL & ALLERGIC TO LISINOPRIL/ACE INHIBITORS*  1  . Calcium Carbonate-Vitamin D 600-400 MG-UNIT tablet Take by mouth.    . Dermatological Products,  Misc. (HYLATOPIC PLUS) CREA     . DiphenhydrAMINE HCl (BENADRYL ALLERGY PO) Take by mouth.    . hydrochlorothiazide (HYDRODIURIL) 25 MG tablet Take by mouth.    . losartan-hydrochlorothiazide (HYZAAR) 100-25 MG tablet TAKE 1 TABLET BY MOUTH DAILY. (Patient not taking: Reported on 02/09/2018) 90 tablet 3  . Multiple Vitamin (MULTI-VITAMINS) TABS Take by mouth.    . polyethylene glycol-electrolytes (NULYTELY/GOLYTELY) 420 g solution START AT 5:00PM DAY BEFORE PROCEDURE DRINK 1 EIGHT OZ GLASS EVERY 20-30 MINS UNTIL STOOLS ARE CLEAR  0   No current facility-administered medications on file prior to visit.     There are no Patient Instructions on file for this visit. No follow-ups on file.   Tynell Winchell A Warren Lindahl, PA-C

## 2018-04-23 DIAGNOSIS — I890 Lymphedema, not elsewhere classified: Secondary | ICD-10-CM | POA: Diagnosis not present

## 2018-05-11 DIAGNOSIS — Z23 Encounter for immunization: Secondary | ICD-10-CM | POA: Diagnosis not present

## 2018-07-24 ENCOUNTER — Encounter (INDEPENDENT_AMBULATORY_CARE_PROVIDER_SITE_OTHER): Payer: Self-pay | Admitting: Vascular Surgery

## 2018-07-24 ENCOUNTER — Other Ambulatory Visit: Payer: Self-pay

## 2018-07-24 ENCOUNTER — Ambulatory Visit (INDEPENDENT_AMBULATORY_CARE_PROVIDER_SITE_OTHER): Payer: PPO | Admitting: Vascular Surgery

## 2018-07-24 VITALS — BP 142/90 | HR 75 | Resp 17 | Ht 62.0 in | Wt 153.0 lb

## 2018-07-24 DIAGNOSIS — I10 Essential (primary) hypertension: Secondary | ICD-10-CM | POA: Diagnosis not present

## 2018-07-24 DIAGNOSIS — I890 Lymphedema, not elsewhere classified: Secondary | ICD-10-CM | POA: Diagnosis not present

## 2018-07-24 NOTE — Assessment & Plan Note (Signed)
The patient has had a good response to the lymphedema pump.  This has been an excellent therapy for her lymphedema symptoms.  She will continue to use this regularly as well as use her compression stockings.  We discussed that if her left arm symptoms progress, then a pump for the arm may also be of benefit going forward.  At this point, I will plan to see her in 1 year but will be happy to see her sooner if her arm symptoms progress in the interim.

## 2018-07-24 NOTE — Patient Instructions (Signed)

## 2018-07-24 NOTE — Progress Notes (Signed)
MRN : [id number]  Summer Bishop is a 67 y.o. (29-Sep-1950) female who presents with chief complaint of  Chief Complaint  Patient presents with  . Follow-up    lymphaedema  .  History of Present Illness: Patient returns today in follow up of her lower extremity lymphedema.  She has been using the lymphedema pump regularly, 3 times a day now and has noticed significant improvement in the pain and swelling in her legs.  She does this in conjunction with compression.  She has noticed some swelling and pain in her left arm and wonders if she could be getting lymphedema in that arm as well.  She denies any fevers or chills.  No chest pain or shortness of breath.  Current Outpatient Medications  Medication Sig Dispense Refill  . albuterol (PROVENTIL HFA;VENTOLIN HFA) 108 (90 Base) MCG/ACT inhaler Inhale 2 puffs into the lungs every 6 (six) hours as needed for wheezing or shortness of breath. 1 Inhaler 0  . alendronate (FOSAMAX) 70 MG tablet Take 1 tablet (70 mg total) by mouth every 7 (seven) days. Take with a full glass of water on an empty stomach. 4 tablet 11  . amLODipine (NORVASC) 5 MG tablet TAKE 1 TABLET BY MOUTH ONCE DAILY*STOP HYZAAR FOR RECALL & ALLERGIC TO LISINOPRIL/ACE INHIBITORS*  1  . Calcium Carbonate-Vitamin D 600-400 MG-UNIT tablet Take by mouth.    . DiphenhydrAMINE HCl (BENADRYL ALLERGY PO) Take by mouth.    . hydrochlorothiazide (HYDRODIURIL) 25 MG tablet Take by mouth.    . Multiple Vitamin (MULTI-VITAMINS) TABS Take by mouth.    . Dermatological Products, Misc. (HYLATOPIC PLUS) CREA     . losartan-hydrochlorothiazide (HYZAAR) 100-25 MG tablet TAKE 1 TABLET BY MOUTH DAILY. (Patient not taking: Reported on 02/09/2018) 90 tablet 3  . polyethylene glycol-electrolytes (NULYTELY/GOLYTELY) 420 g solution START AT 5:00PM DAY BEFORE PROCEDURE DRINK 1 EIGHT OZ GLASS EVERY 20-30 MINS UNTIL STOOLS ARE CLEAR  0   No current facility-administered medications for this visit.     Past  Medical History:  Diagnosis Date  . Asthma   . Chicken pox   . Headache   . Hypertension     Past Surgical History:  Procedure Laterality Date  . ABDOMINAL HYSTERECTOMY    . APPENDECTOMY    . COLONOSCOPY WITH PROPOFOL N/A 03/29/2017   Procedure: COLONOSCOPY WITH PROPOFOL;  Surgeon: Lin Landsman, MD;  Location: [facility] ENDOSCOPY;  Service: Gastroenterology;  Laterality: N/A;  . TONSILLECTOMY      Social History Social History   Tobacco Use  . Smoking status: Never Smoker  . Smokeless tobacco: Never Used  Substance Use Topics  . Alcohol use: No  . Drug use: No     Family History Family History  Problem Relation Age of Onset  . Stroke Mother   . Dementia Mother   . Alzheimer's disease Father   . Kidney cancer Father   . Liver cancer Father   . Stroke Father   . Heart Problems Father   . Breast cancer Sister        late 34's  . Breast cancer Maternal Aunt        3 aunts     Allergies  Allergen Reactions  . Nsaids Anaphylaxis  . Ace Inhibitors Swelling    Tongue swelling  . Aspirin Swelling  . Lisinopril Other (See Comments)  . Naproxen Other (See Comments)  . Sulfa Antibiotics Other (See Comments)   REVIEW OF SYSTEMS (Negative unless checked)  Constitutional: [] ?Weight loss  [] ?Fever  [] ?Chills Cardiac: [] ?Chest pain   [] ?Chest pressure   [] ?Palpitations   [] ?Shortness of breath when laying flat   [] ?Shortness of breath at rest   [] ?Shortness of breath with exertion. Vascular:  [] ?Pain in legs with walking   [] ?Pain in legs at rest   [] ?Pain in legs when laying flat   [] ?Claudication   [] ?Pain in feet when walking  [] ?Pain in feet at rest  [] ?Pain in feet when laying flat   [] ?History of DVT   [] ?Phlebitis   [x] ?Swelling in legs   [x] ?Varicose veins   [] ?Non-healing ulcers Pulmonary:   [] ?Uses home oxygen   [] ?Productive cough   [] ?Hemoptysis   [] ?Wheeze  [] ?COPD   [] ?Asthma Neurologic:  [] ?Dizziness  [] ?Blackouts   [] ?Seizures   [] ?History of stroke    [] ?History of TIA  [] ?Aphasia   [] ?Temporary blindness   [] ?Dysphagia   [] ?Weakness or numbness in arms   [] ?Weakness or numbness in legs Musculoskeletal:  [] ?Arthritis   [] ?Joint swelling   [] ?Joint pain   [] ?Low back pain Hematologic:  [] ?Easy bruising  [] ?Easy bleeding   [] ?Hypercoagulable state   [x] ?Anemic  [] ?Hepatitis Gastrointestinal:  [] ?Blood in stool   [] ?Vomiting blood  [x] ?Gastroesophageal reflux/heartburn   [] ?Abdominal pain Genitourinary:  [] ?Chronic kidney disease   [] ?Difficult urination  [] ?Frequent urination  [] ?Burning with urination   [] ?Hematuria Skin:  [] ?Rashes   [] ?Ulcers   [] ?Wounds Psychological:  [x] ?History of anxiety   [] ? History of major depression.  Physical Examination  BP (!) 142/90   Pulse 75   Resp 17   Ht 5\' 2"  (1.575 m)   Wt 153 lb (69.4 kg)   BMI 27.98 kg/m  Gen:  WD/WN, NAD Head: [place]/AT, No temporalis wasting. Ear/Nose/Throat: Hearing grossly intact, nares w/o erythema or drainage Eyes: Conjunctiva clear. Sclera non-icteric Neck: Supple.  Trachea midline Pulmonary:  Good air movement, no use of accessory muscles.  Cardiac: RRR, no JVD Vascular:  Vessel Right Left  Radial Palpable Palpable                          PT Palpable Palpable  DP Palpable Palpable    Musculoskeletal: M/S 5/5 throughout.  No deformity or atrophy. Trace BLE edema.  Left arm is not appreciably swollen today. Neurologic: Sensation grossly intact in extremities.  Symmetrical.  Speech is fluent.  Psychiatric: Judgment intact, Mood & affect appropriate for pt's clinical situation. Dermatologic: No rashes or ulcers noted.  No cellulitis or open wounds.       Labs No results found for this or any previous visit (from the past 2160 hour(s)).  Radiology No results found.  Assessment/Plan Benign essential HTN blood pressure control important in reducing the progression of atherosclerotic disease. On appropriate oral medications.  Lymphedema The patient has  had a good response to the lymphedema pump.  This has been an excellent therapy for her lymphedema symptoms.  She will continue to use this regularly as well as use her compression stockings.  We discussed that if her left arm symptoms progress, then a pump for the arm may also be of benefit going forward.  At this point, I will plan to see her in 1 year but will be happy to see her sooner if her arm symptoms progress in the interim.    Leotis Pain, MD  07/24/2018 2:43 PM    This note was created with Dragon medical transcription system.  Any errors from dictation are  purely unintentional

## 2018-08-08 ENCOUNTER — Other Ambulatory Visit: Payer: Self-pay | Admitting: Internal Medicine

## 2018-08-08 DIAGNOSIS — Z0000 Encounter for general adult medical examination without abnormal findings: Secondary | ICD-10-CM | POA: Diagnosis not present

## 2018-08-08 DIAGNOSIS — Z1239 Encounter for other screening for malignant neoplasm of breast: Secondary | ICD-10-CM | POA: Diagnosis not present

## 2018-08-08 DIAGNOSIS — Z1231 Encounter for screening mammogram for malignant neoplasm of breast: Secondary | ICD-10-CM

## 2018-08-08 DIAGNOSIS — Z23 Encounter for immunization: Secondary | ICD-10-CM | POA: Diagnosis not present

## 2018-08-08 DIAGNOSIS — I10 Essential (primary) hypertension: Secondary | ICD-10-CM | POA: Diagnosis not present

## 2018-08-08 DIAGNOSIS — M810 Age-related osteoporosis without current pathological fracture: Secondary | ICD-10-CM | POA: Diagnosis not present

## 2018-08-16 ENCOUNTER — Other Ambulatory Visit (HOSPITAL_COMMUNITY): Payer: Self-pay | Admitting: Internal Medicine

## 2018-08-16 ENCOUNTER — Other Ambulatory Visit: Payer: Self-pay | Admitting: Internal Medicine

## 2018-08-16 DIAGNOSIS — N289 Disorder of kidney and ureter, unspecified: Secondary | ICD-10-CM

## 2018-08-22 ENCOUNTER — Ambulatory Visit
Admission: RE | Admit: 2018-08-22 | Discharge: 2018-08-22 | Disposition: A | Discharge status: Home / Self Care | Payer: PPO | Source: Ambulatory Visit | Attending: Internal Medicine | Admitting: Internal Medicine

## 2018-08-22 DIAGNOSIS — N289 Disorder of kidney and ureter, unspecified: Secondary | ICD-10-CM | POA: Diagnosis not present

## 2018-08-22 DIAGNOSIS — N189 Chronic kidney disease, unspecified: Secondary | ICD-10-CM | POA: Diagnosis not present

## 2018-09-06 ENCOUNTER — Ambulatory Visit
Admission: RE | Admit: 2018-09-06 | Discharge: 2018-09-06 | Disposition: A | Discharge status: Home / Self Care | Payer: PPO | Source: Ambulatory Visit | Attending: Internal Medicine | Admitting: Internal Medicine

## 2018-09-06 DIAGNOSIS — Z1231 Encounter for screening mammogram for malignant neoplasm of breast: Secondary | ICD-10-CM | POA: Diagnosis not present

## 2018-09-18 DIAGNOSIS — H5203 Hypermetropia, bilateral: Secondary | ICD-10-CM | POA: Diagnosis not present

## 2018-09-18 DIAGNOSIS — H52223 Regular astigmatism, bilateral: Secondary | ICD-10-CM | POA: Diagnosis not present

## 2018-09-18 DIAGNOSIS — H01134 Eczematous dermatitis of left upper eyelid: Secondary | ICD-10-CM | POA: Diagnosis not present

## 2018-09-18 DIAGNOSIS — H25013 Cortical age-related cataract, bilateral: Secondary | ICD-10-CM | POA: Diagnosis not present

## 2018-09-18 DIAGNOSIS — H2513 Age-related nuclear cataract, bilateral: Secondary | ICD-10-CM | POA: Diagnosis not present

## 2018-09-18 DIAGNOSIS — H01131 Eczematous dermatitis of right upper eyelid: Secondary | ICD-10-CM | POA: Diagnosis not present

## 2018-09-18 DIAGNOSIS — H524 Presbyopia: Secondary | ICD-10-CM | POA: Diagnosis not present

## 2018-09-24 DIAGNOSIS — I10 Essential (primary) hypertension: Secondary | ICD-10-CM | POA: Diagnosis not present

## 2018-12-13 DIAGNOSIS — H18413 Arcus senilis, bilateral: Secondary | ICD-10-CM | POA: Diagnosis not present

## 2018-12-13 DIAGNOSIS — H2512 Age-related nuclear cataract, left eye: Secondary | ICD-10-CM | POA: Diagnosis not present

## 2018-12-13 DIAGNOSIS — H2513 Age-related nuclear cataract, bilateral: Secondary | ICD-10-CM | POA: Diagnosis not present

## 2018-12-13 DIAGNOSIS — H25013 Cortical age-related cataract, bilateral: Secondary | ICD-10-CM | POA: Diagnosis not present

## 2018-12-13 DIAGNOSIS — H25043 Posterior subcapsular polar age-related cataract, bilateral: Secondary | ICD-10-CM | POA: Diagnosis not present

## 2019-01-02 DIAGNOSIS — H25012 Cortical age-related cataract, left eye: Secondary | ICD-10-CM | POA: Diagnosis not present

## 2019-01-02 DIAGNOSIS — H2512 Age-related nuclear cataract, left eye: Secondary | ICD-10-CM | POA: Diagnosis not present

## 2019-01-02 HISTORY — PX: CATARACT EXTRACTION W/ INTRAOCULAR LENS IMPLANT: SHX1309

## 2019-01-03 DIAGNOSIS — H2511 Age-related nuclear cataract, right eye: Secondary | ICD-10-CM | POA: Diagnosis not present

## 2019-01-16 DIAGNOSIS — H25011 Cortical age-related cataract, right eye: Secondary | ICD-10-CM | POA: Diagnosis not present

## 2019-01-16 DIAGNOSIS — H2511 Age-related nuclear cataract, right eye: Secondary | ICD-10-CM | POA: Diagnosis not present

## 2019-01-16 HISTORY — PX: CATARACT EXTRACTION W/ INTRAOCULAR LENS IMPLANT: SHX1309

## 2019-01-27 DIAGNOSIS — H353131 Nonexudative age-related macular degeneration, bilateral, early dry stage: Secondary | ICD-10-CM | POA: Diagnosis not present

## 2019-01-27 DIAGNOSIS — H20042 Secondary noninfectious iridocyclitis, left eye: Secondary | ICD-10-CM | POA: Diagnosis not present

## 2019-01-27 DIAGNOSIS — H5319 Other subjective visual disturbances: Secondary | ICD-10-CM | POA: Diagnosis not present

## 2019-01-27 DIAGNOSIS — H35363 Drusen (degenerative) of macula, bilateral: Secondary | ICD-10-CM | POA: Diagnosis not present

## 2019-01-27 DIAGNOSIS — H35373 Puckering of macula, bilateral: Secondary | ICD-10-CM | POA: Diagnosis not present

## 2019-01-27 DIAGNOSIS — H5712 Ocular pain, left eye: Secondary | ICD-10-CM | POA: Diagnosis not present

## 2019-02-06 DIAGNOSIS — E7800 Pure hypercholesterolemia, unspecified: Secondary | ICD-10-CM | POA: Diagnosis not present

## 2019-02-06 DIAGNOSIS — I10 Essential (primary) hypertension: Secondary | ICD-10-CM | POA: Diagnosis not present

## 2019-02-06 DIAGNOSIS — M810 Age-related osteoporosis without current pathological fracture: Secondary | ICD-10-CM | POA: Diagnosis not present

## 2019-02-25 DIAGNOSIS — H20041 Secondary noninfectious iridocyclitis, right eye: Secondary | ICD-10-CM | POA: Diagnosis not present

## 2019-04-23 DIAGNOSIS — M8588 Other specified disorders of bone density and structure, other site: Secondary | ICD-10-CM | POA: Diagnosis not present

## 2019-04-30 DIAGNOSIS — K644 Residual hemorrhoidal skin tags: Secondary | ICD-10-CM | POA: Diagnosis not present

## 2019-04-30 DIAGNOSIS — K648 Other hemorrhoids: Secondary | ICD-10-CM | POA: Diagnosis not present

## 2019-06-11 DIAGNOSIS — K644 Residual hemorrhoidal skin tags: Secondary | ICD-10-CM | POA: Diagnosis not present

## 2019-06-11 DIAGNOSIS — K648 Other hemorrhoids: Secondary | ICD-10-CM | POA: Diagnosis not present

## 2019-07-30 ENCOUNTER — Ambulatory Visit (INDEPENDENT_AMBULATORY_CARE_PROVIDER_SITE_OTHER): Payer: PPO | Admitting: Vascular Surgery

## 2019-07-30 ENCOUNTER — Other Ambulatory Visit: Payer: Self-pay

## 2019-07-30 ENCOUNTER — Encounter (INDEPENDENT_AMBULATORY_CARE_PROVIDER_SITE_OTHER): Payer: Self-pay | Admitting: Vascular Surgery

## 2019-07-30 VITALS — BP 134/87 | HR 85 | Resp 16 | Wt 160.0 lb

## 2019-07-30 DIAGNOSIS — I10 Essential (primary) hypertension: Secondary | ICD-10-CM | POA: Diagnosis not present

## 2019-07-30 DIAGNOSIS — I890 Lymphedema, not elsewhere classified: Secondary | ICD-10-CM | POA: Diagnosis not present

## 2019-07-30 NOTE — Assessment & Plan Note (Signed)
Her symptoms are a little worse because she has not been able to do her typical conservative therapy with a lymphedema pump.  She is going to try to be more diligent with this once her husband has healed.  She is still in a pretty good place.  We will follow her on an annual basis.

## 2019-07-30 NOTE — Progress Notes (Signed)
MRN : [id number]  Summer Bishop is a 69 y.o. (April 09, 1951) female who presents with chief complaint of  Chief Complaint  Patient presents with  . Follow-up  .  History of Present Illness: Patient returns today in follow up of her lymphedema.  Over the past several months, her mother has passed away and her husband had emergency surgery and she has been caring for him.  This has resulted in her having less time to do her lymphedema pump and take care of herself.  She has noticed more swelling in her legs as well as her arm although her symptoms are still reasonably well controlled with compression stockings.  No new ulceration or infection.  Current Outpatient Medications  Medication Sig Dispense Refill  . albuterol (PROVENTIL HFA;VENTOLIN HFA) 108 (90 Base) MCG/ACT inhaler Inhale 2 puffs into the lungs every 6 (six) hours as needed for wheezing or shortness of breath. 1 Inhaler 0  . alendronate (FOSAMAX) 70 MG tablet Take 1 tablet (70 mg total) by mouth every 7 (seven) days. Take with a full glass of water on an empty stomach. 4 tablet 11  . amLODipine (NORVASC) 10 MG tablet daily.   1  . Calcium Carbonate-Vitamin D 600-400 MG-UNIT tablet Take by mouth.    . DiphenhydrAMINE HCl (BENADRYL ALLERGY PO) Take by mouth.    . hydrochlorothiazide (HYDRODIURIL) 25 MG tablet Take by mouth.    . Multiple Vitamin (MULTI-VITAMINS) TABS Take by mouth.    Marland Kitchen UNABLE TO FIND Focus multivitamin    . Dermatological Products, Misc. (HYLATOPIC PLUS) CREA     . losartan-hydrochlorothiazide (HYZAAR) 100-25 MG tablet TAKE 1 TABLET BY MOUTH DAILY. (Patient not taking: Reported on 02/09/2018) 90 tablet 3  . polyethylene glycol-electrolytes (NULYTELY/GOLYTELY) 420 g solution START AT 5:00PM DAY BEFORE PROCEDURE DRINK 1 EIGHT OZ GLASS EVERY 20-30 MINS UNTIL STOOLS ARE CLEAR  0   No current facility-administered medications for this visit.    Past Medical History:  Diagnosis Date  . Asthma   . Chicken pox   .  Headache   . Hypertension     Past Surgical History:  Procedure Laterality Date  . ABDOMINAL HYSTERECTOMY    . APPENDECTOMY    . COLONOSCOPY WITH PROPOFOL N/A 03/29/2017   Procedure: COLONOSCOPY WITH PROPOFOL;  Surgeon: Lin Landsman, MD;  Location[facility] ENDOSCOPY;  Service: Gastroenterology;  Laterality: N/A;  . TONSILLECTOMY       Social History   Tobacco Use  . Smoking status: Never Smoker  . Smokeless tobacco: Never Used  Substance Use Topics  . Alcohol use: No  . Drug use: No    Family History  Problem Relation Age of Onset  . Stroke Mother   . Dementia Mother   . Alzheimer's disease Father   . Kidney cancer Father   . Liver cancer Father   . Stroke Father   . Heart Problems Father   . Breast cancer Sister        late 37's  . Breast cancer Maternal Aunt        3 aunts     Allergies  Allergen Reactions  . Nsaids Anaphylaxis  . Ace Inhibitors Swelling    Tongue swelling  . Aspirin Swelling  . Lisinopril Other (See Comments)  . Naproxen Other (See Comments)  . Sulfa Antibiotics Other (See Comments)     REVIEW OF SYSTEMS(Negative unless checked)  Constitutional: [] ??Weight loss [] ??Fever [] ??Chills Cardiac: [] ??Chest pain [] ??Chest pressure [] ??Palpitations [] ??Shortness of breath when  laying flat [] ??Shortness of breath at rest [] ??Shortness of breath with exertion. Vascular: [] ??Pain in legs with walking [] ??Pain in legs at rest [] ??Pain in legs when laying flat [] ??Claudication [] ??Pain in feet when walking [] ??Pain in feet at rest [] ??Pain in feet when laying flat [] ??History of DVT [] ??Phlebitis [x] ??Swelling in legs [x] ??Varicose veins [] ??Non-healing ulcers Pulmonary: [] ??Uses home oxygen [] ??Productive cough [] ??Hemoptysis [] ??Wheeze [] ??COPD [] ??Asthma Neurologic: [] ??Dizziness [] ??Blackouts [] ??Seizures [] ??History of stroke [] ??History of TIA [] ??Aphasia [] ??Temporary blindness  [] ??Dysphagia [] ??Weakness or numbness in arms [] ??Weakness or numbness in legs Musculoskeletal: [] ??Arthritis [] ??Joint swelling [] ??Joint pain [] ??Low back pain Hematologic: [] ??Easy bruising [] ??Easy bleeding [] ??Hypercoagulable state [x] ??Anemic [] ??Hepatitis Gastrointestinal: [] ??Blood in stool [] ??Vomiting blood [x] ??Gastroesophageal reflux/heartburn [] ??Abdominal pain Genitourinary: [] ??Chronic kidney disease [] ??Difficult urination [] ??Frequent urination [] ??Burning with urination [] ??Hematuria Skin: [] ??Rashes [] ??Ulcers [] ??Wounds Psychological: [x] ??History of anxiety [] ??History of major depression.  Physical Examination  BP 134/87 (BP Location: Right Arm)   Pulse 85   Resp 16   Wt 160 lb (72.6 kg)   BMI 29.26 kg/m  Gen:  WD/WN, NAD Head: /AT, No temporalis wasting. Ear/Nose/Throat: Hearing grossly intact, nares w/o erythema or drainage Eyes: Conjunctiva clear. Sclera non-icteric Neck: Supple.  Trachea midline Pulmonary:  Good air movement, no use of accessory muscles.  Cardiac: RRR, no JVD Vascular:  Vessel Right Left  Radial Palpable Palpable                          PT Palpable Palpable  DP Palpable Palpable   Gastrointestinal: soft, non-tender/non-distended. No guarding/reflex.  Musculoskeletal: M/S 5/5 throughout.  No deformity or atrophy.  Trace right lower extremity edema, 1+ left lower extremity edema. Neurologic: Sensation grossly intact in extremities.  Symmetrical.  Speech is fluent.  Psychiatric: Judgment intact, Mood & affect appropriate for pt's clinical situation. Dermatologic: No rashes or ulcers noted.  No cellulitis or open wounds.       Labs No results found for this or any previous visit (from the past 2160 hour(s)).  Radiology No results found.  Assessment/Plan Benign essential HTN blood pressure control important in reducing the progression of atherosclerotic disease. On appropriate  oral medications.  Lymphedema Her symptoms are a little worse because she has not been able to do her typical conservative therapy with a lymphedema pump.  She is going to try to be more diligent with this once her husband has healed.  She is still in a pretty good place.  We will follow her on an annual basis.    Leotis Pain, MD  07/30/2019 3:16 PM    This note was created with Dragon medical transcription system.  Any errors from dictation are purely unintentional

## 2019-08-05 DIAGNOSIS — E7800 Pure hypercholesterolemia, unspecified: Secondary | ICD-10-CM | POA: Diagnosis not present

## 2019-08-05 DIAGNOSIS — M810 Age-related osteoporosis without current pathological fracture: Secondary | ICD-10-CM | POA: Diagnosis not present

## 2019-08-05 DIAGNOSIS — I10 Essential (primary) hypertension: Secondary | ICD-10-CM | POA: Diagnosis not present

## 2019-08-12 DIAGNOSIS — Z1231 Encounter for screening mammogram for malignant neoplasm of breast: Secondary | ICD-10-CM | POA: Diagnosis not present

## 2019-08-12 DIAGNOSIS — I10 Essential (primary) hypertension: Secondary | ICD-10-CM | POA: Diagnosis not present

## 2019-08-12 DIAGNOSIS — E7800 Pure hypercholesterolemia, unspecified: Secondary | ICD-10-CM | POA: Diagnosis not present

## 2019-08-12 DIAGNOSIS — Z0000 Encounter for general adult medical examination without abnormal findings: Secondary | ICD-10-CM | POA: Diagnosis not present

## 2019-09-07 ENCOUNTER — Ambulatory Visit: Payer: PPO | Attending: Internal Medicine

## 2019-09-07 DIAGNOSIS — Z23 Encounter for immunization: Secondary | ICD-10-CM

## 2019-09-07 NOTE — Progress Notes (Signed)
   Covid-19 Vaccination Clinic  Name:  Summer Bishop    MRN: [id number] DOB: [birth year]  09/07/2019  [name] was observed post Covid-19 immunization for 30 minutes based on pre-vaccination screening without incidence. She was provided with Vaccine Information Sheet and instruction to access the V-Safe system.   [name] was instructed to call 911 with any severe reactions post vaccine: Marland Kitchen Difficulty breathing  . Swelling of your face and throat  . A fast heartbeat  . A bad rash all over your body  . Dizziness and weakness    Immunizations Administered    Name Date Dose VIS Date Route   Pfizer COVID-19 Vaccine 09/07/2019 10:48 AM 0.3 mL 07/05/2019 Intramuscular   Manufacturer: [place]   Lot: [id number]   [place]: [id number]

## 2019-09-29 ENCOUNTER — Ambulatory Visit: Payer: PPO | Attending: Internal Medicine

## 2019-09-29 DIAGNOSIS — Z23 Encounter for immunization: Secondary | ICD-10-CM

## 2019-09-29 NOTE — Progress Notes (Signed)
   Covid-19 Vaccination Clinic  Name:  Summer Bishop    MRN: [id number] DOB: [birth year]  09/29/2019  [name] was observed post Covid-19 immunization for 30 minutes based on pre-vaccination screening without incident. She was provided with Vaccine Information Sheet and instruction to access the V-Safe system.   [name] was instructed to call 911 with any severe reactions post vaccine: Marland Kitchen Difficulty breathing  . Swelling of face and throat  . A fast heartbeat  . A bad rash all over body  . Dizziness and weakness   Immunizations Administered    Name Date Dose VIS Date Route   Pfizer COVID-19 Vaccine 09/29/2019  3:15 PM 0.3 mL 07/05/2019 Intramuscular   Manufacturer: [place]   Lot: [id number]   [place]: [id number]

## 2019-10-10 DIAGNOSIS — H903 Sensorineural hearing loss, bilateral: Secondary | ICD-10-CM | POA: Diagnosis not present

## 2020-02-03 DIAGNOSIS — I10 Essential (primary) hypertension: Secondary | ICD-10-CM | POA: Diagnosis not present

## 2020-02-03 DIAGNOSIS — E7800 Pure hypercholesterolemia, unspecified: Secondary | ICD-10-CM | POA: Diagnosis not present

## 2020-02-11 DIAGNOSIS — I10 Essential (primary) hypertension: Secondary | ICD-10-CM | POA: Diagnosis not present

## 2020-02-11 DIAGNOSIS — J45990 Exercise induced bronchospasm: Secondary | ICD-10-CM | POA: Diagnosis not present

## 2020-02-11 DIAGNOSIS — E7800 Pure hypercholesterolemia, unspecified: Secondary | ICD-10-CM | POA: Diagnosis not present

## 2020-02-11 DIAGNOSIS — G609 Hereditary and idiopathic neuropathy, unspecified: Secondary | ICD-10-CM | POA: Diagnosis not present

## 2020-02-11 DIAGNOSIS — Z9989 Dependence on other enabling machines and devices: Secondary | ICD-10-CM | POA: Insufficient documentation

## 2020-02-11 DIAGNOSIS — G43809 Other migraine, not intractable, without status migrainosus: Secondary | ICD-10-CM | POA: Diagnosis not present

## 2020-03-17 ENCOUNTER — Other Ambulatory Visit: Payer: Self-pay

## 2020-03-17 ENCOUNTER — Emergency Department: Payer: PPO

## 2020-03-17 ENCOUNTER — Emergency Department
Admission: EM | Admit: 2020-03-17 | Discharge: 2020-03-18 | Disposition: A | Discharge status: Home / Self Care | Payer: PPO | Attending: Emergency Medicine | Admitting: Emergency Medicine

## 2020-03-17 ENCOUNTER — Encounter: Payer: Self-pay | Admitting: Emergency Medicine

## 2020-03-17 DIAGNOSIS — Z79899 Other long term (current) drug therapy: Secondary | ICD-10-CM | POA: Insufficient documentation

## 2020-03-17 DIAGNOSIS — S6992XA Unspecified injury of left wrist, hand and finger(s), initial encounter: Secondary | ICD-10-CM | POA: Diagnosis present

## 2020-03-17 DIAGNOSIS — J45909 Unspecified asthma, uncomplicated: Secondary | ICD-10-CM | POA: Diagnosis not present

## 2020-03-17 DIAGNOSIS — Y999 Unspecified external cause status: Secondary | ICD-10-CM | POA: Insufficient documentation

## 2020-03-17 DIAGNOSIS — Y929 Unspecified place or not applicable: Secondary | ICD-10-CM | POA: Insufficient documentation

## 2020-03-17 DIAGNOSIS — I10 Essential (primary) hypertension: Secondary | ICD-10-CM | POA: Insufficient documentation

## 2020-03-17 DIAGNOSIS — M79642 Pain in left hand: Secondary | ICD-10-CM | POA: Diagnosis not present

## 2020-03-17 DIAGNOSIS — Y939 Activity, unspecified: Secondary | ICD-10-CM | POA: Insufficient documentation

## 2020-03-17 DIAGNOSIS — Z7951 Long term (current) use of inhaled steroids: Secondary | ICD-10-CM | POA: Diagnosis not present

## 2020-03-17 DIAGNOSIS — S52572A Other intraarticular fracture of lower end of left radius, initial encounter for closed fracture: Secondary | ICD-10-CM

## 2020-03-17 DIAGNOSIS — X58XXXA Exposure to other specified factors, initial encounter: Secondary | ICD-10-CM | POA: Insufficient documentation

## 2020-03-17 DIAGNOSIS — M25532 Pain in left wrist: Secondary | ICD-10-CM | POA: Diagnosis not present

## 2020-03-17 HISTORY — DX: Lymphedema, not elsewhere classified: I89.0

## 2020-03-17 MED ORDER — OXYCODONE-ACETAMINOPHEN 5-325 MG PO TABS
1.0000 | ORAL_TABLET | Freq: Four times a day (QID) | ORAL | 0 refills | Status: DC | PRN
Start: 2020-03-17 — End: 2020-03-20

## 2020-03-17 MED ORDER — OXYCODONE-ACETAMINOPHEN 5-325 MG PO TABS
1.0000 | ORAL_TABLET | Freq: Once | ORAL | Status: AC
  Administered 2020-03-17: 1 via ORAL
  Filled 2020-03-17: qty 1

## 2020-03-17 NOTE — ED Provider Notes (Signed)
[facility] Emergency Department Provider Note  ____________________________________________  Time seen: Approximately 10:27 PM  I have reviewed the triage vital signs and the nursing notes.   HISTORY  Chief Complaint Wrist Pain    HPI Summer Bishop is a 69 y.o. female who presents the emergency department complaining of left wrist injury.  Patient states that she is supposed to walk with a cane, but she was pushing her trash can out to the edge of the road when she slipped and fell onto an outstretched hand.  Patient is having pain, swelling to the left wrist.   She denies any other injuries or complaints.  Limited range of motion to the wrist but full range of motion to all the digits left hand.  No open wounds are reported.        Past Medical History:  Diagnosis Date  . Asthma   . Chicken pox   . Headache   . Hypertension   . Lymphedema     Patient Active Problem List   Diagnosis Date Noted  . Lymphedema 03/21/2018  . Pain in joint, lower leg 02/09/2018  . Exercise-induced asthma 05/04/2017  . 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 Pure hypercholesterolemia 05/04/2017  . Screening for colorectal cancer   . IBS (irritable bowel syndrome) 02/14/2017  . Rectal bleeding 02/14/2017  . Plantar wart 02/14/2017  . 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 Annual physical exam 02/12/2016  . Eczema 12/03/2013  . Benign essential HTN 12/03/2013  . Anxiety 12/03/2013  . Migraine 12/03/2013  . Other closed fractures of distal end of radius (alone) 12/03/2013  . Hereditary and idiopathic peripheral neuropathy 12/03/2013    Past Surgical History:  Procedure Laterality Date  . ABDOMINAL HYSTERECTOMY    . APPENDECTOMY    . COLONOSCOPY WITH PROPOFOL N/A 03/29/2017   Procedure: COLONOSCOPY WITH PROPOFOL;  Surgeon: Lin Landsman, MD;  Location: [facility] ENDOSCOPY;  Service: Gastroenterology;  Laterality: N/A;  . TONSILLECTOMY      Prior to Admission  medications   Medication Sig Start Date End Date Taking? Authorizing Provider  albuterol (PROVENTIL HFA;VENTOLIN HFA) 108 (90 Base) MCG/ACT inhaler Inhale 2 puffs into the lungs every 6 (six) hours as needed for wheezing or shortness of breath. 02/14/17   Coral Spikes, DO  alendronate (FOSAMAX) 70 MG tablet Take 1 tablet (70 mg total) by mouth every 7 (seven) days. Take with a full glass of water on an empty stomach. 04/25/17   Thersa Salt G, DO  amLODipine (NORVASC) 10 MG tablet daily.  02/05/18   [provider]  Calcium Carbonate-Vitamin D 600-400 MG-UNIT tablet Take by mouth.    [provider]  Dermatological Products, [place]. (HYLATOPIC PLUS) CREA  09/15/13   [provider]  DiphenhydrAMINE HCl (BENADRYL ALLERGY PO) Take by mouth.    [provider]  hydrochlorothiazide (HYDRODIURIL) 25 MG tablet Take by mouth. 02/05/18 07/30/19  [provider]  losartan-hydrochlorothiazide (HYZAAR) 100-25 MG tablet TAKE 1 TABLET BY MOUTH DAILY. Patient not taking: Reported on 02/09/2018 04/18/17   Coral Spikes, DO  Multiple Vitamin (MULTI-VITAMINS) TABS Take by mouth.    [provider]  oxyCODONE-acetaminophen (PERCOCET/ROXICET) 5-325 MG tablet Take 1 tablet by mouth every 6 (six) hours as needed for severe pain. 03/17/20   Creed Kail, Charline Bills, PA-C  polyethylene glycol-electrolytes (NULYTELY/GOLYTELY) 420 g solution START AT 5:00PM DAY BEFORE PROCEDURE DRINK 1 EIGHT OZ GLASS EVERY 20-30 MINS UNTIL STOOLS ARE CLEAR 03/16/17   [provider]  UNABLE TO FIND Focus multivitamin    [provider]    Allergies Nsaids, Ace inhibitors, Aspirin, Lisinopril, Naproxen, and Sulfa antibiotics  Family History  Problem Relation Age of Onset  . Stroke Mother   . Dementia Mother   . Alzheimer's disease Father   . Kidney cancer Father   . Liver cancer Father   . Stroke Father   . Heart Problems Father   . Breast cancer Sister        late 65's  .  Breast cancer Maternal Aunt        3 aunts    Social History Social History   Tobacco Use  . Smoking status: Never Smoker  . Smokeless tobacco: Never Used  Vaping Use  . Vaping Use: Never used  Substance Use Topics  . Alcohol use: No  . Drug use: No     Review of Systems  Constitutional: No fever/chills Eyes: No visual changes. No discharge ENT: No upper respiratory complaints. Cardiovascular: no chest pain. Respiratory: no cough. No SOB. Gastrointestinal: No abdominal pain.  No nausea, no vomiting.  No diarrhea.  No constipation. Musculoskeletal: Left wrist injury secondary to a fall Skin: Negative for rash, abrasions, lacerations, ecchymosis. Neurological: Negative for headaches, focal weakness or numbness. 10-point ROS otherwise negative.  ____________________________________________   PHYSICAL EXAM:  VITAL SIGNS: ED Triage Vitals  Enc Vitals Group     BP 03/17/20 2108 123/86     Pulse Rate 03/17/20 2108 81     Resp 03/17/20 2108 18     Temp 03/17/20 2108 98.5 F (36.9 C)     Temp Source 03/17/20 2108 Oral     SpO2 03/17/20 2108 97 %     Weight 03/17/20 2108 157 lb (71.2 kg)     Height --      Head Circumference --      Peak Flow --      Pain Score 03/17/20 2126 4     Pain Loc --      Pain Edu? --      Excl. in [place]? --      Constitutional: Alert and oriented. Well appearing and in no acute distress. Eyes: Conjunctivae are normal. PERRL. EOMI. Head: Atraumatic. ENT:      Ears:       Nose: No congestion/rhinnorhea.      Mouth/Throat: Mucous membranes are moist.  Neck: No stridor.    Cardiovascular: Normal rate, regular rhythm. Normal S1 and S2.  Good peripheral circulation. Respiratory: Normal respiratory effort without tachypnea or retractions. Lungs CTAB. Good air entry to the bases with no decreased or absent breath sounds. Musculoskeletal: Full range of motion to all extremities. No gross deformities appreciated.  Visualization of the left wrist  reveals edema but no significant deformity.  Patient is guarding the wrist at this time.  Limited range of motion.  Significant tenderness with palpable abnormality about the distal radius.  Radial pulse intact.  Sensation intact all digits.  Capillary refill less than 2 seconds all digits.  Examination of the elbow and shoulder is unremarkable. Neurologic:  Normal speech and language. No gross focal neurologic deficits are appreciated.  Skin:  Skin is warm, dry and intact. No rash noted. Psychiatric: Mood and affect are normal. Speech and behavior are normal. Patient exhibits appropriate insight and judgement.   ____________________________________________   LABS (all labs ordered are listed, but only abnormal results are displayed)  Labs Reviewed - No data to display ____________________________________________  EKG   ____________________________________________  RADIOLOGY I personally viewed and evaluated these images as part of my medical decision making, as well as reviewing the written report by the radiologist.  DG Wrist Complete Left  Result Date: 03/17/2020 CLINICAL DATA:  Recent fall with left hand pain and wrist pain, initial encounter EXAM: LEFT WRIST - COMPLETE 3+ VIEW COMPARISON:  None. FINDINGS: Comminuted distal radial fracture is noted with intra-articular involvement and impaction at the fracture site. No other fracture is seen. IMPRESSION: Comminuted distal radial fracture with impaction and intra-articular involvement. Electronically Signed   By: Inez Catalina M.D.   On: 03/17/2020 21:51    ____________________________________________    PROCEDURES  Procedure(s) performed:    .Splint Application  Date/Time: 03/17/2020 11:21 PM Performed by: Darletta Moll, PA-C Authorized by: Darletta Moll, PA-C   Consent:    Consent obtained:  Verbal   Consent given by:  Patient   Risks discussed:  Pain and swelling Pre-procedure details:    Sensation:   Normal Procedure details:    Laterality:  Left   Location:  Wrist   Wrist:  L wrist   Splint type:  Volar short arm   Supplies:  Cotton padding, Ortho-Glass and elastic bandage Post-procedure details:    Pain:  Improved   Sensation:  Normal   Patient tolerance of procedure:  Tolerated well, no immediate complications      Medications  oxyCODONE-acetaminophen (PERCOCET/ROXICET) 5-325 MG per tablet 1 tablet (1 tablet Oral Given 03/17/20 2258)     ____________________________________________   INITIAL IMPRESSION / ASSESSMENT AND PLAN / ED COURSE  Pertinent labs & imaging results that were available during my care of the patient were reviewed by me and considered in my medical decision making (see chart for details).  Review of the [place] CSRS was performed in accordance of the [place] prior to dispensing any controlled drugs.           Patient's diagnosis is consistent with distal radius fracture of the left wrist.  Patient presented to the emergency department after mechanical fall.  Patient injured her left wrist with impacted, comminuted fracture.  Patient will be splinted here in the emergency department.  Patient will be referred to orthopedics for further management.  Patient will be prescribed Percocet for pain relief..  Patient is given ED precautions to return to the ED for any worsening or new symptoms.     ____________________________________________  FINAL CLINICAL IMPRESSION(S) / ED DIAGNOSES  Final diagnoses:  Other closed intra-articular fracture of distal end of left radius, initial encounter      NEW MEDICATIONS STARTED DURING THIS VISIT:  ED Discharge Orders         Ordered    oxyCODONE-acetaminophen (PERCOCET/ROXICET) 5-325 MG tablet  Every 6 hours PRN        03/17/20 2230              This chart was dictated using voice recognition software/Dragon. Despite best efforts to proofread, errors can occur which can change the meaning. Any change was  purely unintentional.    Darletta Moll, PA-C 03/17/20 2321    Lucrezia Starch, MD 03/17/20 [phone]

## 2020-03-17 NOTE — ED Triage Notes (Addendum)
Pt presents to ED with left wrist pain after falling and landing on her left hand on her driveway. Pt states she is supposed to be using a cane but she didn't because she was pushing the trashcan. No obvious deformity noted at this time. Ice applied in triage.

## 2020-03-18 ENCOUNTER — Other Ambulatory Visit
Admission: RE | Admit: 2020-03-18 | Discharge: 2020-03-18 | Disposition: A | Discharge status: Home / Self Care | Payer: PPO | Source: Ambulatory Visit | Attending: Orthopedic Surgery | Admitting: Orthopedic Surgery

## 2020-03-18 ENCOUNTER — Other Ambulatory Visit: Payer: Self-pay

## 2020-03-18 ENCOUNTER — Other Ambulatory Visit: Payer: Self-pay | Admitting: Orthopedic Surgery

## 2020-03-18 DIAGNOSIS — Z01812 Encounter for preprocedural laboratory examination: Secondary | ICD-10-CM | POA: Diagnosis not present

## 2020-03-18 DIAGNOSIS — S52572A Other intraarticular fracture of lower end of left radius, initial encounter for closed fracture: Secondary | ICD-10-CM | POA: Diagnosis not present

## 2020-03-18 DIAGNOSIS — Z20822 Contact with and (suspected) exposure to covid-19: Secondary | ICD-10-CM | POA: Diagnosis not present

## 2020-03-19 LAB — SARS CORONAVIRUS 2 (TAT 6-24 HRS): SARS Coronavirus 2: NEGATIVE

## 2020-03-20 ENCOUNTER — Ambulatory Visit: Payer: PPO

## 2020-03-20 ENCOUNTER — Encounter: Payer: Self-pay | Admitting: Orthopedic Surgery

## 2020-03-20 ENCOUNTER — Other Ambulatory Visit: Payer: Self-pay

## 2020-03-20 ENCOUNTER — Ambulatory Visit
Admission: RE | Admit: 2020-03-20 | Discharge: 2020-03-20 | Disposition: A | Discharge status: Home / Self Care | Payer: PPO | Attending: Orthopedic Surgery | Admitting: Orthopedic Surgery

## 2020-03-20 ENCOUNTER — Encounter
Admission: RE | Disposition: A | Discharge status: Home / Self Care | Payer: Self-pay | Source: Home / Self Care | Attending: Orthopedic Surgery

## 2020-03-20 ENCOUNTER — Ambulatory Visit: Payer: PPO | Admitting: Certified Registered Nurse Anesthetist

## 2020-03-20 DIAGNOSIS — E785 Hyperlipidemia, unspecified: Secondary | ICD-10-CM | POA: Diagnosis not present

## 2020-03-20 DIAGNOSIS — J45909 Unspecified asthma, uncomplicated: Secondary | ICD-10-CM | POA: Insufficient documentation

## 2020-03-20 DIAGNOSIS — S52572A Other intraarticular fracture of lower end of left radius, initial encounter for closed fracture: Secondary | ICD-10-CM | POA: Diagnosis not present

## 2020-03-20 DIAGNOSIS — M1990 Unspecified osteoarthritis, unspecified site: Secondary | ICD-10-CM | POA: Insufficient documentation

## 2020-03-20 DIAGNOSIS — Y92007 Garden or yard of unspecified non-institutional (private) residence as the place of occurrence of the external cause: Secondary | ICD-10-CM | POA: Diagnosis not present

## 2020-03-20 DIAGNOSIS — W1830XA Fall on same level, unspecified, initial encounter: Secondary | ICD-10-CM | POA: Diagnosis not present

## 2020-03-20 DIAGNOSIS — I10 Essential (primary) hypertension: Secondary | ICD-10-CM | POA: Diagnosis not present

## 2020-03-20 DIAGNOSIS — S52592A Other fractures of lower end of left radius, initial encounter for closed fracture: Secondary | ICD-10-CM | POA: Diagnosis not present

## 2020-03-20 DIAGNOSIS — S52502A Unspecified fracture of the lower end of left radius, initial encounter for closed fracture: Secondary | ICD-10-CM | POA: Diagnosis not present

## 2020-03-20 DIAGNOSIS — Z79899 Other long term (current) drug therapy: Secondary | ICD-10-CM | POA: Insufficient documentation

## 2020-03-20 DIAGNOSIS — Z8781 Personal history of (healed) traumatic fracture: Secondary | ICD-10-CM

## 2020-03-20 HISTORY — PX: OPEN REDUCTION INTERNAL FIXATION (ORIF) DISTAL RADIAL FRACTURE: SHX5989

## 2020-03-20 SURGERY — OPEN REDUCTION INTERNAL FIXATION (ORIF) DISTAL RADIUS FRACTURE
Anesthesia: General | Site: Wrist | Laterality: Left

## 2020-03-20 MED ORDER — PROPOFOL 10 MG/ML IV BOLUS
INTRAVENOUS | Status: DC | PRN
  Administered 2020-03-20: 120 mg via INTRAVENOUS

## 2020-03-20 MED ORDER — CHLORHEXIDINE GLUCONATE 0.12 % MT SOLN
OROMUCOSAL | Status: AC
  Administered 2020-03-20: 15 mL via OROMUCOSAL
  Filled 2020-03-20: qty 15

## 2020-03-20 MED ORDER — NEOMYCIN-POLYMYXIN B GU 40-200000 IR SOLN
Status: DC | PRN
  Administered 2020-03-20: 2 mL

## 2020-03-20 MED ORDER — SCOPOLAMINE 1 MG/3DAYS TD PT72
1.0000 | MEDICATED_PATCH | TRANSDERMAL | Status: DC
  Administered 2020-03-20: 1.5 mg via TRANSDERMAL

## 2020-03-20 MED ORDER — ACETAMINOPHEN 10 MG/ML IV SOLN
INTRAVENOUS | Status: DC | PRN
  Administered 2020-03-20: 1000 mg via INTRAVENOUS

## 2020-03-20 MED ORDER — CHLORHEXIDINE GLUCONATE 0.12 % MT SOLN: 15.0000 mL | Freq: Once | OROMUCOSAL | Status: AC

## 2020-03-20 MED ORDER — DEXAMETHASONE SODIUM PHOSPHATE 10 MG/ML IJ SOLN
INTRAMUSCULAR | Status: DC | PRN
  Administered 2020-03-20: 8 mg via INTRAVENOUS

## 2020-03-20 MED ORDER — LIDOCAINE HCL (PF) 2 % IJ SOLN
INTRAMUSCULAR | Status: AC
  Filled 2020-03-20: qty 5

## 2020-03-20 MED ORDER — DEXAMETHASONE SODIUM PHOSPHATE 10 MG/ML IJ SOLN
INTRAMUSCULAR | Status: AC
  Filled 2020-03-20: qty 1

## 2020-03-20 MED ORDER — LACTATED RINGERS IV SOLN: INTRAVENOUS | Status: DC

## 2020-03-20 MED ORDER — LIDOCAINE HCL (CARDIAC) PF 100 MG/5ML IV SOSY
PREFILLED_SYRINGE | INTRAVENOUS | Status: DC | PRN
  Administered 2020-03-20: 80 mg via INTRAVENOUS

## 2020-03-20 MED ORDER — FENTANYL CITRATE (PF) 100 MCG/2ML IJ SOLN
INTRAMUSCULAR | Status: AC
  Filled 2020-03-20: qty 2

## 2020-03-20 MED ORDER — HYDROCODONE-ACETAMINOPHEN 5-325 MG PO TABS
1.0000 | ORAL_TABLET | ORAL | 0 refills | Status: DC | PRN
Start: 2020-03-20 — End: 2021-10-21

## 2020-03-20 MED ORDER — FENTANYL CITRATE (PF) 100 MCG/2ML IJ SOLN
25.0000 ug | INTRAMUSCULAR | Status: DC | PRN
  Administered 2020-03-20 (×4): 25 ug via INTRAVENOUS

## 2020-03-20 MED ORDER — ACETAMINOPHEN 10 MG/ML IV SOLN
INTRAVENOUS | Status: AC
  Filled 2020-03-20: qty 100

## 2020-03-20 MED ORDER — ORAL CARE MOUTH RINSE: 15.0000 mL | Freq: Once | OROMUCOSAL | Status: AC

## 2020-03-20 MED ORDER — ONDANSETRON HCL 4 MG/2ML IJ SOLN
INTRAMUSCULAR | Status: DC | PRN
  Administered 2020-03-20: 4 mg via INTRAVENOUS

## 2020-03-20 MED ORDER — ONDANSETRON HCL 4 MG/2ML IJ SOLN: 4.0000 mg | Freq: Once | INTRAMUSCULAR | Status: DC | PRN

## 2020-03-20 MED ORDER — FENTANYL CITRATE (PF) 100 MCG/2ML IJ SOLN
INTRAMUSCULAR | Status: DC | PRN
Start: 2020-03-20 — End: 2020-03-20
  Administered 2020-03-20 (×2): 25 ug via INTRAVENOUS

## 2020-03-20 MED ORDER — ONDANSETRON HCL 4 MG/2ML IJ SOLN
INTRAMUSCULAR | Status: AC
  Filled 2020-03-20: qty 2

## 2020-03-20 MED ORDER — CEFAZOLIN SODIUM-DEXTROSE 2-4 GM/100ML-% IV SOLN
INTRAVENOUS | Status: AC
  Filled 2020-03-20: qty 100

## 2020-03-20 MED ORDER — SCOPOLAMINE 1 MG/3DAYS TD PT72
MEDICATED_PATCH | TRANSDERMAL | Status: AC
  Filled 2020-03-20: qty 1

## 2020-03-20 MED ORDER — PROPOFOL 500 MG/50ML IV EMUL
INTRAVENOUS | Status: DC | PRN
  Administered 2020-03-20: 50 ug/kg/min via INTRAVENOUS

## 2020-03-20 MED ORDER — CEFAZOLIN SODIUM-DEXTROSE 2-4 GM/100ML-% IV SOLN
2.0000 g | INTRAVENOUS | Status: AC
  Administered 2020-03-20: 2 g via INTRAVENOUS

## 2020-03-20 SURGICAL SUPPLY — 42 items
APL PRP STRL LF DISP 70% ISPRP (MISCELLANEOUS) ×1
BIT DRILL 2 FAST STEP (BIT) ×2 IMPLANT
BIT DRILL 2.5X4 QC (BIT) ×2 IMPLANT
BNDG ELASTIC 4X5.8 VLCR STR LF (GAUZE/BANDAGES/DRESSINGS) ×3 IMPLANT
CANISTER SUCT 1200ML W/VALVE (MISCELLANEOUS) ×3 IMPLANT
CHLORAPREP W/TINT 26 (MISCELLANEOUS) ×3 IMPLANT
COVER WAND RF STERILE (DRAPES) ×3 IMPLANT
CUFF TOURN SGL QUICK 18X4 (TOURNIQUET CUFF) ×2 IMPLANT
DRAPE FLUOR MINI C-ARM 54X84 (DRAPES) ×3 IMPLANT
ELECT REM PT RETURN 9FT ADLT (ELECTROSURGICAL) ×3
ELECTRODE REM PT RTRN 9FT ADLT (ELECTROSURGICAL) ×1 IMPLANT
GAUZE SPONGE 4X4 12PLY STRL (GAUZE/BANDAGES/DRESSINGS) ×3 IMPLANT
GAUZE XEROFORM 1X8 LF (GAUZE/BANDAGES/DRESSINGS) ×6 IMPLANT
GLOVE SURG SYN 9.0  PF PI (GLOVE) ×2
GLOVE SURG SYN 9.0 PF PI (GLOVE) ×1 IMPLANT
GOWN SRG 2XL LVL 4 RGLN SLV (GOWNS) ×1 IMPLANT
GOWN STRL NON-REIN 2XL LVL4 (GOWNS) ×3
GOWN STRL REUS W/ TWL LRG LVL3 (GOWN DISPOSABLE) ×1 IMPLANT
GOWN STRL REUS W/TWL LRG LVL3 (GOWN DISPOSABLE) ×3
K-WIRE 1.6 (WIRE) ×3
K-WIRE FX5X1.6XNS BN SS (WIRE) ×1
KIT TURNOVER KIT A (KITS) ×3 IMPLANT
KWIRE FX5X1.6XNS BN SS (WIRE) IMPLANT
NDL FILTER BLUNT 18X1 1/2 (NEEDLE) ×1 IMPLANT
NEEDLE FILTER BLUNT 18X 1/2SAF (NEEDLE) ×2
NEEDLE FILTER BLUNT 18X1 1/2 (NEEDLE) ×1 IMPLANT
NS IRRIG 500ML POUR BTL (IV SOLUTION) ×3 IMPLANT
PACK EXTREMITY (MISCELLANEOUS) ×3 IMPLANT
PAD CAST CTTN 4X4 STRL (SOFTGOODS) ×2 IMPLANT
PADDING CAST COTTON 4X4 STRL (SOFTGOODS) ×6
PEG SUBCHONDRAL SMOOTH 2.0X14 (Peg) ×2 IMPLANT
PEG SUBCHONDRAL SMOOTH 2.0X18 (Peg) ×6 IMPLANT
PEG SUBCHONDRAL SMOOTH 2.0X22 (Peg) ×4 IMPLANT
PLATE SHORT 57 NRRW LT (Plate) ×2 IMPLANT
SCALPEL PROTECTED #15 DISP (BLADE) ×6 IMPLANT
SCREW CORT 3.5X10 LNG (Screw) ×8 IMPLANT
SPLINT CAST 1 STEP 3X12 (MISCELLANEOUS) ×3 IMPLANT
SUT ETHILON 4-0 (SUTURE) ×3
SUT ETHILON 4-0 FS2 18XMFL BLK (SUTURE) ×1
SUT VICRYL 3-0 27IN (SUTURE) ×3 IMPLANT
SUTURE ETHLN 4-0 FS2 18XMF BLK (SUTURE) ×1 IMPLANT
SYR 3ML LL SCALE MARK (SYRINGE) ×3 IMPLANT

## 2020-03-20 NOTE — H&P (Signed)
Patient presents with   Left Wrist - Pain   Wrist Pain  H&P ORIF LT DISTAL RADIUS [id number]/Wisdom Seybold   Reason for Visit Summer Bishop is a 69 y.o. who present secondary to pain to the left wrist. Last evening she had taken her trashcan to the curb and upon the way back to the house using her cane she tripped and fell on outstretched left arm. Immediately had pain. Was seen in the emergency room for which x-rays revealed a fracture of the distal radius. She presents today for evaluation. Has had no previous problems with the left wrist. She has had no evidence of any carpal tunnel in the past. Denies any numbness, tingling or burning sensation.  Past Medical History Past Medical History:  Diagnosis Date   Allergic state   Anxiety 12/03/2013   Arthritis   Asthma without status asthmaticus, unspecified   Cataract cortical, senile   Eczema, unspecified 12/03/2013   History of chicken pox   Hyperlipidemia   Hypertension   IBS (irritable bowel syndrome) 12/03/2013   Lymphedema of left arm   Lymphedema of left leg   Migraine 12/03/2013   Osteoporosis, post-menopausal   Past Surgical History Past Surgical History:  Procedure Laterality Date   APPENDECTOMY 1960s   CATARACT EXTRACTION W/ INTRAOCULAR LENS IMPLANT Left 01/02/2019   CATARACT EXTRACTION W/ INTRAOCULAR LENS IMPLANT Right 01/16/2019   EXTRACTION CATARACT EXTRACAPSULAR W/INSERTION INTRAOCULAR PROSTHESIS Bilateral 01/02/2019, 01/16/2019   HYSTERECTOMY 2004  secondary to bleeding   TONSILLECTOMY 1955   Past Family History Family History  Problem Relation Age of Onset   Transient ischemic attack Mother   Osteoporosis (Thinning of bones) Mother  Deceased   Stroke Mother  Deceased   High blood pressure (Hypertension) Mother  Deceased   Glaucoma Mother  Deceased   Kidney cancer Father   Alzheimer's disease Father  Deceased   Transient ischemic attack Father   Stroke Father  Deceased   High  blood pressure (Hypertension) Father  Deceased   Osteoporosis (Thinning of bones) Father  Deceased   Breast cancer Sister   Lupus Sister   Medications Current Outpatient Medications Ordered in Epic  Medication Sig Dispense Refill   albuterol 90 mcg/actuation inhaler TAKE 2 PUFFS BY MOUTH EVERY 4 HOURS AS NEEDED FOR WHEEZE OR FOR SHORTNESS OF BREATH & 30 MIN BEFORE 18 g 11   alendronate (FOSAMAX) 70 MG tablet TAKE 1 TABLET BY MOUTH EVERY 7 DAYS WITH A FULL GLASS OF WATER. DO NOT LIE DOWN FOR THE NEXT 30 MIN. 12 tablet 1   amLODIPine (NORVASC) 10 MG tablet Take 1 tablet (10 mg total) by mouth once daily 90 tablet 3   calcium carbonate-vitamin D3 (CALCIUM 600 + D,3,) 600 mg(1,500mg ) -400 unit tablet Take 2 tablets by mouth once daily   diphenhydrAMINE (BENADRYL ALLERGY) 25 mg tablet Take 25 mg by mouth continuously as needed for Itching.   hydroCHLOROthiazide (HYDRODIURIL) 25 MG tablet Take 1 tablet (25 mg total) by mouth once daily 90 tablet 3   multivitamin tablet Take 1 tablet by mouth once daily.   oxyCODONE-acetaminophen (PERCOCET) 5-325 mg tablet   vit C/E/Zn/coppr/lutein/zeaxan (PRESERVISION AREDS-2 ORAL) Take 2 capsules by mouth once daily   No current Epic-ordered facility-administered medications on file.   Allergies Allergies  Allergen Reactions   Ace Inhibitors Swelling  Tongue swelling   Aspirin Unknown   Naprosyn [Naproxen] Unknown   Nsaids (Non-Steroidal Anti-Inflammatory Drug) Unknown   Sulfa (Sulfonamide Antibiotics) Unknown   Zestril [Lisinopril] Unknown  Review of Systems A comprehensive 14 point ROS was performed, reviewed, and the pertinent orthopaedic findings are documented in the HPI.  Exam BP 136/80   Ht 157.5 cm (5\' 2" )   Wt 71.9 kg (158 lb 9.6 oz)   BMI 29.01 kg/m   General: Well-developed well-nourished female seen in no acute distress.   HEENT: Atraumatic,normocephalic. Pupils are equal and reactive to light. Oropharynx is  clear with moist mucosa  Lungs: Clear to auscultation bilaterally   Cardiovascular: Regular rate and rhythm. Normal S1, S2. No murmurs. No appreciable gallops or rubs. Peripheral pulses are palpable.  Abdomen: Soft, non-tender, nondistended. Bowel sounds present  Extremity: Patient is noted to be able to move her fingers well. There is minimal swelling of the fingers noted. She is elevating the left upper extremity.  Neurological:  The patient is alert and oriented Sensation to light touch appears to be intact and within normal limits  Vascular :  Peripheral pulses felt to be palpable. Capillary refill appears to be intact and within normal limits Moderate swelling or edema to the lower extremities secondary to patient having lymphedema to both the upper and lower extremities  X-ray  X-[facility] taken at [facility] emergency room FINDINGS: Comminuted distal radial fracture is noted with intra-articular involvement and impaction at the fracture site. No other fracture is seen. IMPRESSION: Comminuted distal radial fracture with impaction and intra-articular involvement. Electronically Signed By: Inez Catalina M.D. On:   Impression  1. Fracture distal left radius  Plan   1. Dr. Rudene Christians has seen and evaluated this patient and has discussed procedure as well as risks and complications. Questions were answered to the patient's satisfaction. 2. Surgery scheduled for 03/20/2020. 3. Return to clinic 1 week postop  This note was generated in part with voice recognition software and I apologize for any typographical errors that were not detected and corrected   Watt Climes PA  Electronically signed by Regino Bellow, PA at 03/18/2020 12:29 PM EDT  Reviewed paper H+P, No changes noted.

## 2020-03-20 NOTE — Anesthesia Postprocedure Evaluation (Signed)
Anesthesia Post Note  Patient: Summer Bishop  Procedure(s) Performed: OPEN REDUCTION INTERNAL FIXATION (ORIF) DISTAL RADIAL FRACTURE (Left Wrist)  Patient location during evaluation: PACU Anesthesia Type: General Level of consciousness: awake and alert and oriented Pain management: pain level controlled Vital Signs Assessment: post-procedure vital signs reviewed and stable Respiratory status: spontaneous breathing Cardiovascular status: blood pressure returned to baseline Anesthetic complications: no   No complications documented.   Last Vitals:  Vitals:   03/20/20 1537 03/20/20 1542  BP:    Pulse: 69 75  Resp: 19 14  Temp:    SpO2: 95% 95%    Last Pain:  Vitals:   03/20/20 1542  PainSc: 4                  Danique Hartsough

## 2020-03-20 NOTE — Anesthesia Preprocedure Evaluation (Signed)
Anesthesia Evaluation  Patient identified by MRN, date of birth, ID band Patient awake    Reviewed: Allergy & Precautions, H&P , NPO status , Patient's Chart, lab work & pertinent test results  History of Anesthesia Complications Negative for: history of anesthetic complications  Airway Mallampati: I  TM Distance: >3 FB     Dental  (+) Teeth Intact   Pulmonary asthma , neg sleep apnea, neg COPD,    breath sounds clear to auscultation       Cardiovascular hypertension, (-) angina(-) Past MI and (-) Cardiac Stents (-) dysrhythmias  Rhythm:regular Rate:Normal     Neuro/Psych  Headaches, Anxiety    GI/Hepatic negative GI ROS, Neg liver ROS,   Endo/Other  negative endocrine ROS  Renal/GU      Musculoskeletal   Abdominal   Peds  Hematology negative hematology ROS (+)   Anesthesia Other Findings Past Medical History: No date: Asthma No date: Chicken pox No date: Headache No date: Hypertension No date: Lymphedema  Past Surgical History: No date: ABDOMINAL HYSTERECTOMY No date: APPENDECTOMY 03/29/2017: COLONOSCOPY WITH PROPOFOL; N/A     Comment:  Procedure: COLONOSCOPY WITH PROPOFOL;  Surgeon: Lin Landsman, MD;  Location: [facility] ENDOSCOPY;  Service:               Gastroenterology;  Laterality: N/A; No date: TONSILLECTOMY  BMI    Body Mass Index: 28.72 kg/m      Reproductive/Obstetrics negative OB ROS                             Anesthesia Physical Anesthesia Plan  ASA: II  Anesthesia Plan: General LMA   Post-op Pain Management:    Induction:   PONV Risk Score and Plan: Dexamethasone, Ondansetron, Treatment may vary due to age or medical condition and Scopolamine patch - Pre-op  Airway Management Planned:   Additional Equipment:   Intra-op Plan:   Post-operative Plan:   Informed Consent: I have reviewed the patients History and Physical, chart, labs  and discussed the procedure including the risks, benefits and alternatives for the proposed anesthesia with the patient or authorized representative who has indicated his/her understanding and acceptance.     Dental Advisory Given  Plan Discussed with: Anesthesiologist, CRNA and Surgeon  Anesthesia Plan Comments:         Anesthesia Quick Evaluation

## 2020-03-20 NOTE — Transfer of Care (Signed)
Immediate Anesthesia Transfer of Care Note  Patient: Summer Bishop  Procedure(s) Performed: OPEN REDUCTION INTERNAL FIXATION (ORIF) DISTAL RADIAL FRACTURE (Left Wrist)  Patient Location: PACU  Anesthesia Type:General  Level of Consciousness: awake, oriented, drowsy and patient cooperative  Airway & Oxygen Therapy: Patient Spontanous Breathing  Post-op Assessment: Report given to RN and Post -op Vital signs reviewed and stable  Post vital signs: Reviewed and stable  Last Vitals:  Vitals Value Taken Time  BP 132/74 03/20/20 1500  Temp 36.6 C 03/20/20 1459  Pulse 71 03/20/20 1505  Resp 12 03/20/20 1505  SpO2 100 % 03/20/20 1505  Vitals shown include unvalidated device data.  Last Pain:  Vitals:   03/20/20 1229  PainSc: 2          Complications: No complications documented.

## 2020-03-20 NOTE — Op Note (Signed)
03/20/2020  2:55 PM  PATIENT:  Summer Bishop  69 y.o. female  PRE-OPERATIVE DIAGNOSIS:  Unspecified fracture of the lower end of left radius, initial encounter S52.502A comminuted intra-articular  POST-OPERATIVE DIAGNOSIS:  Unspecified fracture of the lower end of left radius, initial encounter S52.502A same  PROCEDURE:  Procedure(s): OPEN REDUCTION INTERNAL FIXATION (ORIF) DISTAL RADIAL FRACTURE (Left)  SURGEON: Laurene Footman, MD  ASSISTANTS: None  ANESTHESIA:   general  EBL:  Total I/O In: 700 [I.V.:500; IV Piggyback:200] Out: 5 [Blood:5]  BLOOD ADMINISTERED:none  DRAINS: none   LOCAL MEDICATIONS USED:  NONE  SPECIMEN:  No Specimen  DISPOSITION OF SPECIMEN:  N/A  COUNTS:  YES  TOURNIQUET:   Total Tourniquet Time Documented: Forearm (Left) - 18 minutes Total: Forearm (Left) - 18 minutes   IMPLANTS: [employer] narrow standard left DVR plate with multiple smooth pegs and cortical screws  DICTATION: .Dragon Dictation patient was brought to the operating room and after adequate anesthesia was obtained the left arm was prepped and draped in the usual sterile manner.  After patient identification and timeout procedures were completed tourniquet was raised to her 50 mmHg and a volar approach made centered over the FCR tendon.  The tendon sheath was incised and the tendon retracted radially with deep fascia incised the pronator was exposed.  This was separated off the bone proximal and distal from the radial edge.  With traction have been applied at the start of the case to fingertrap traction most of the length was restored additional traction was applied and this restored length although still some dorsal tilt.  A distal first approach was then performed with the plate applied the appropriate position on the distal fragment K wire used to check this position and hold the plate in place as multiple smooth pegs were placed into the distal fragment.  The plate was then  brought down to the shaft with 4 cortical screws drilling and placing 10 mm screws.  C arm view showed near anatomic alignment including a radial styloid fracture and some significant displaced intra-articular fragments noted at the start of the case.  Released for intra-articular fragments.  Traction was removed and under fluoroscopic views the fracture appeared stable.  The wound was irrigated and tourniquet let down.  The wound was then closed with 3-0 Vicryl subcutaneously and 4-0 nylon in a simple interrupted fashion.  Xeroform 4 x 4's web roll and Ace wrap applied with a volar splint.  PLAN OF CARE: Discharge to home after PACU  PATIENT DISPOSITION:  PACU - hemodynamically stable.

## 2020-03-20 NOTE — Anesthesia Procedure Notes (Signed)
Procedure Name: LMA Insertion Date/Time: 03/20/2020 2:09 PM Performed by: Lowry Bowl, CRNA Pre-anesthesia Checklist: Patient identified, Suction available, Emergency Drugs available and Patient being monitored Patient Re-evaluated:Patient Re-evaluated prior to induction Oxygen Delivery Method: Circle system utilized Preoxygenation: Pre-oxygenation with 100% oxygen Induction Type: IV induction LMA: LMA inserted LMA Size: 3.5 Number of attempts: 1 Placement Confirmation: positive ETCO2 and breath sounds checked- equal and bilateral Tube secured with: Tape Dental Injury: Teeth and Oropharynx as per pre-operative assessment

## 2020-03-20 NOTE — Discharge Instructions (Signed)
AMBULATORY SURGERY  DISCHARGE INSTRUCTIONS   1) The drugs that you were given will stay in your system until tomorrow so for the next 24 hours you should not:  A) Drive an automobile B) Make any legal decisions C) Drink any alcoholic beverage   2) You may resume regular meals tomorrow.  Today it is better to start with liquids and gradually work up to solid foods.  You may eat anything you prefer, but it is better to start with liquids, then soup and crackers, and gradually work up to solid foods.   3) Please notify your doctor immediately if you have any unusual bleeding, trouble breathing, redness and pain at the surgery site, drainage, fever, or pain not relieved by medication.    4) Additional Instructions:        Please contact your physician with any problems or Same Day Surgery at [phone], Monday through Friday 6 am to 4 pm, or [place] at [facility] number at [phone].Work on finger motion is much as you can.   Ice to the back of the wrist today and tomorrow should help with pain and swelling.   Leave splint and dressing in place. Pain medicine as directed

## 2020-03-23 ENCOUNTER — Encounter: Payer: Self-pay | Admitting: Orthopedic Surgery

## 2020-03-25 DIAGNOSIS — Z9889 Other specified postprocedural states: Secondary | ICD-10-CM | POA: Diagnosis not present

## 2020-03-25 DIAGNOSIS — Z8781 Personal history of (healed) traumatic fracture: Secondary | ICD-10-CM | POA: Diagnosis not present

## 2020-04-03 DIAGNOSIS — Z9889 Other specified postprocedural states: Secondary | ICD-10-CM | POA: Diagnosis not present

## 2020-04-03 DIAGNOSIS — Z8781 Personal history of (healed) traumatic fracture: Secondary | ICD-10-CM | POA: Diagnosis not present

## 2020-04-17 DIAGNOSIS — Z8781 Personal history of (healed) traumatic fracture: Secondary | ICD-10-CM | POA: Diagnosis not present

## 2020-04-17 DIAGNOSIS — Z9889 Other specified postprocedural states: Secondary | ICD-10-CM | POA: Diagnosis not present

## 2020-04-27 ENCOUNTER — Ambulatory Visit: Payer: PPO | Attending: Orthopedic Surgery | Admitting: Occupational Therapy

## 2020-04-27 ENCOUNTER — Other Ambulatory Visit: Payer: Self-pay

## 2020-04-27 ENCOUNTER — Encounter: Payer: Self-pay | Admitting: Occupational Therapy

## 2020-04-27 DIAGNOSIS — M25532 Pain in left wrist: Secondary | ICD-10-CM | POA: Diagnosis not present

## 2020-04-27 DIAGNOSIS — M25632 Stiffness of left wrist, not elsewhere classified: Secondary | ICD-10-CM | POA: Insufficient documentation

## 2020-04-27 DIAGNOSIS — I890 Lymphedema, not elsewhere classified: Secondary | ICD-10-CM | POA: Diagnosis not present

## 2020-04-27 DIAGNOSIS — M6281 Muscle weakness (generalized): Secondary | ICD-10-CM | POA: Diagnosis not present

## 2020-04-27 DIAGNOSIS — L905 Scar conditions and fibrosis of skin: Secondary | ICD-10-CM | POA: Insufficient documentation

## 2020-04-27 NOTE — Therapy (Signed)
[facility] [address], [place], [address] Phone: [phone]   Fax:  [phone]  Occupational Therapy Evaluation  Patient Details  Name: Summer Bishop MRN: [id number] Date of Birth: [birth year] Referring Provider (OT): Rachelle Hora   Encounter Date: 04/27/2020   OT End of Session - 04/27/20 1240    Visit Number 1    Number of Visits 12    Date for OT Re-Evaluation 06/08/20    OT Start Time 0901    OT Stop Time 1002    OT Time Calculation (min) 61 min    Activity Tolerance Patient tolerated treatment well    Behavior During Therapy [facility] for tasks assessed/performed           Past Medical History:  Diagnosis Date   Asthma    Chicken pox    Headache    Hypertension    Lymphedema     Past Surgical History:  Procedure Laterality Date   ABDOMINAL HYSTERECTOMY     APPENDECTOMY     COLONOSCOPY WITH PROPOFOL N/A 03/29/2017   Procedure: COLONOSCOPY WITH PROPOFOL;  Surgeon: Lin Landsman, MD;  Location: [facility] ENDOSCOPY;  Service: Gastroenterology;  Laterality: N/A;   OPEN REDUCTION INTERNAL FIXATION (ORIF) DISTAL RADIAL FRACTURE Left 03/20/2020   Procedure: OPEN REDUCTION INTERNAL FIXATION (ORIF) DISTAL RADIAL FRACTURE;  Surgeon: Hessie Knows, MD;  Location: [facility] ORS;  Service: Orthopedics;  Laterality: Left;   TONSILLECTOMY      There were no vitals filed for this visit.   Subjective Assessment - 04/27/20 1223    Subjective  My wrist do not want to bend, and pain when trying to use it - and more swelling - I have lymphedema and wear compression sleeves on my legs and arms - but could not do that since surgery    Pertinent History Pt fell on 03/17/2020 when she push out trashcan and did not use her cane - Had ORIF on L distal radius fx on 03/20/2020 by Dr Rudene Christians - seen few times by PA - and refer to OT /hand therapy    Patient Stated Goals Want the pain and motion better in my L wrist and hand  - increase strength to be able to use it like I did before with cooking, house work , put on my compression sleeves, paint , garden and sew  ,    Currently in Pain? Yes    Pain Score 3     Pain Location Wrist    Pain Orientation Left    Pain Descriptors / Indicators Aching;Tightness    Pain Type Surgical pain    Pain Onset More than a month ago    Pain Frequency Intermittent             [facility] OT Assessment - 04/27/20 0001      Assessment   Medical Diagnosis L  distal radius fx with ORIF    Referring Provider (OT) Rachelle Hora    Onset Date/Surgical Date 03/20/20    Hand Dominance Left      Precautions   Required Braces or Orthoses --   do not wear any braces     Balance Screen   Has the patient fallen in the past 6 months Yes    How many times? --   pt cannot remember - has memory issues     Home  Environment   Lives With Spouse      Prior Function   Vocation  Retired    Psychologist, forensic, sewing, gardening, walking, work out dvd       AROM   Right Forearm Pronation 90 Degrees    Right Forearm Supination 85 Degrees    Left Forearm Pronation 90 Degrees    Left Forearm Supination 75 Degrees    Right Wrist Extension 70 Degrees    Right Wrist Flexion 93 Degrees    Right Wrist Radial Deviation 18 Degrees    Right Wrist Ulnar Deviation 30 Degrees    Left Wrist Extension 34 Degrees    Left Wrist Flexion 48 Degrees    Left Wrist Radial Deviation 12 Degrees    Left Wrist Ulnar Deviation 20 Degrees      Left Hand AROM   L Thumb Opposition to Index --   Opposition to base of 5th            LYMPHEDEMA/ONCOLOGY QUESTIONNAIRE - 04/27/20 0001      Right Upper Extremity Lymphedema   10 cm Proximal to Olecranon Process 35.3 cm    Olecranon Process 25 cm    15 cm Proximal to Ulnar Styloid Process 24.5 cm    10 cm Proximal to Ulnar Styloid Process 20.5 cm    Just Proximal to Ulnar Styloid Process 16.5 cm    Across Hand at [employer] 18 cm    At [place] of 2nd Digit 6 cm      At [facility] of Thumb 6 cm      Left Upper Extremity Lymphedema   10 cm Proximal to Olecranon Process 32.4 cm    Olecranon Process 24 cm    15 cm Proximal to Ulnar Styloid Process 22.8 cm    10 cm Proximal to Ulnar Styloid Process 20.5 cm    Just Proximal to Ulnar Styloid Process 16.5 cm    Across Hand at [employer] 17.5 cm    At [place] of 2nd Digit 6.1 cm    At Base of Thumb 6 cm                  OT Treatments/Exercises (OP) - 04/27/20 0001      LUE Contrast Bath   Time 9 minutes    Comments decrease edema, increase ROM - prior to review of HEP            Contrast - 2-3 x day  Scar massage - cica scar pad for night time use isotoner glove use as much as she can   AAROM for wrist flexion , ext, RD, UD 10 reps hold 5 sec  AROM for RD, UD, flexion , extention  12 reps  2 -3 x day Tendon glides and opposition  Ed on positions to avoid for cubital tunnel and ice to elbow  Keep pain under 2/10        OT Education - 04/27/20 1239    Education Details findings of eval and HEP    Person(s) Educated Patient    Methods Explanation;Demonstration;Tactile cues;Verbal cues;Handout    Comprehension Verbal cues required;Returned demonstration;Verbalized understanding            OT Short Term Goals - 04/27/20 1246      OT SHORT TERM GOAL #1   Title Pt to be independent in HEP to increase increase AROM , decrease scar adhesion,  pain and edema in L wrist and hand    Baseline no knowledge in HEP    Time 3    Period Weeks    Status New  Target Date 05/18/20      OT SHORT TERM GOAL #2   Title Pt report no sensation changes or numbness in 4thand 5th digits    Baseline cubital tunnel tender and positive Tinel -and pt report 4th and 5th digits numbness    Time 3    Period Weeks    Target Date 05/18/20             OT Long Term Goals - 04/27/20 1248      OT LONG TERM GOAL #1   Title L wrist AROM increase to [facility] for pt to use hand in more than 75% of ADL's  and IADLs    Baseline decrease ROM in all planes -see flowsheet and function score on PRWHE 24/50    Time 6    Period Weeks    Status New    Target Date 06/08/20      OT LONG TERM GOAL #2   Title L wrist strength increase for pt to carry more than 8 lbs and push up from chair with no increase symptoms    Baseline pain and edema - and decrease AROM in all planes - 5 1/2 wks s/p    Time 6    Period Weeks    Status New    Target Date 06/08/20      OT LONG TERM GOAL #3   Title L grip and prehension strength increase to more than 75% compare to R to carry 8 lbs, put on compression garments without increase symptoms    Baseline 5 1/2 wks s/p    Time 6    Period Weeks    Status New    Target Date 06/08/20                 Plan - 04/27/20 1240    Clinical Impression Statement Pt is 5 1/2 wks s/p L distal radius fx with ORIF - pt show decrease AROM and strength in L wrist and hand, increase edema and pain -report numbness in 4th and 5th digit- positive Tinel at Cubital tunnel and tenderness -  pt has lymphedema history in bilateral LE and UE - and wear compression garments on all 4 extremities during day and use pump on her legs - L worse than R - pt not wearing splint at all and putting on her LE compression on - but results in an increase of pain and edema- pt can benefit from skiled OT servies to adress problem areas and increase independency in ADL's and IADL's    OT Occupational Profile and History Problem Focused Assessment - Including review of records relating to presenting problem    Occupational performance deficits (Please refer to evaluation for details): ADL's;IADL's;Play;Leisure;Social Participation    Body Structure / Function / Physical Skills ADL;Edema;Decreased knowledge of precautions;Flexibility;IADL;Pain;Sensation;Scar mobility;ROM;UE functional use    Rehab Potential Good    Clinical Decision Making Limited treatment options, no task modification necessary     Comorbidities Affecting Occupational Performance: May have comorbidities impacting occupational performance   Lymphedema in bilateral LE and UE ,   Modification or Assistance to Complete Evaluation  No modification of tasks or assist necessary to complete eval    OT Frequency 2x / week    OT Duration 6 weeks    OT Treatment/Interventions Self-care/ADL training;Contrast Bath;Fluidtherapy;Paraffin;DME and/or AE instruction;Manual Therapy;Passive range of motion;Scar mobilization;Therapeutic exercise;Manual lymph drainage;Patient/family education;Splinting    Plan assess progress with HEP    OT Home Exercise Plan see pt instructions  Consulted and Agree with Plan of Care Patient           Patient will benefit from skilled therapeutic intervention in order to improve the following deficits and impairments:   Body Structure / Function / Physical Skills: ADL, Edema, Decreased knowledge of precautions, Flexibility, IADL, Pain, Sensation, Scar mobility, ROM, UE functional use       Visit Diagnosis: Pain in left wrist - Plan: Ot plan of care cert/re-cert  Stiffness of left wrist, not elsewhere classified - Plan: Ot plan of care cert/re-cert  Muscle weakness (generalized) - Plan: Ot plan of care cert/re-cert  Scar condition and fibrosis of skin - Plan: Ot plan of care cert/re-cert  Lymphedema, not elsewhere classified - Plan: Ot plan of care cert/re-cert    Problem List Patient Active Problem List   Diagnosis Date Noted   Lymphedema 03/21/2018   Pain in joint, lower leg 02/09/2018   Exercise-induced asthma 05/04/2017   Postmenopausal osteoporosis 05/04/2017   Pure hypercholesterolemia 05/04/2017   Screening for colorectal cancer    IBS (irritable bowel syndrome) 02/14/2017   Rectal bleeding 02/14/2017   Plantar wart 02/14/2017   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, [place], [address] Phone: [phone]   Fax:  [phone]  Name: Summer Bishop MRN: [id number] Date of Birth: February 09, 1951

## 2020-04-27 NOTE — Patient Instructions (Signed)
Contrast - 2-3 x day  Scar massage - cica scar pad for night time use isotoner glove use as much as she can   AAROM for wrist flexion , ext, RD, UD 10 reps hold 5 sec  AROM for RD, UD, flexion , extention  12 reps  2 -3 x day Tendon glides and opposition  Ed on positions to avoid for cubital tunnel and ice to elbow  Keep pain under 2/10

## 2020-05-01 ENCOUNTER — Other Ambulatory Visit: Payer: Self-pay

## 2020-05-01 ENCOUNTER — Ambulatory Visit: Payer: PPO | Admitting: Occupational Therapy

## 2020-05-01 DIAGNOSIS — I890 Lymphedema, not elsewhere classified: Secondary | ICD-10-CM

## 2020-05-01 DIAGNOSIS — M25632 Stiffness of left wrist, not elsewhere classified: Secondary | ICD-10-CM

## 2020-05-01 DIAGNOSIS — M6281 Muscle weakness (generalized): Secondary | ICD-10-CM

## 2020-05-01 DIAGNOSIS — M25532 Pain in left wrist: Secondary | ICD-10-CM

## 2020-05-01 DIAGNOSIS — L905 Scar conditions and fibrosis of skin: Secondary | ICD-10-CM

## 2020-05-01 NOTE — Therapy (Signed)
[facility] [address] [place], [address] Phone: [phone]   Fax:  [phone]  Occupational Therapy Treatment  Patient Details  Name: Summer Bishop MRN: [id number] Date of Birth: 09/25/1950 Referring Provider (OT): Rachelle Hora   Encounter Date: 05/01/2020   OT End of Session - 05/01/20 1202    Visit Number 2    Number of Visits 12    Date for OT Re-Evaluation 06/08/20    OT Start Time 1121    OT Stop Time 1158    OT Time Calculation (min) 37 min           Past Medical History:  Diagnosis Date  . Asthma   . Chicken pox   . Headache   . Hypertension   . Lymphedema     Past Surgical History:  Procedure Laterality Date  . ABDOMINAL HYSTERECTOMY    . APPENDECTOMY    . COLONOSCOPY WITH PROPOFOL N/A 03/29/2017   Procedure: COLONOSCOPY WITH PROPOFOL;  Surgeon: Lin Landsman, MD;  Location: [facility];  Service: Gastroenterology;  Laterality: N/A;  . OPEN REDUCTION INTERNAL FIXATION (ORIF) DISTAL RADIAL FRACTURE Left 03/20/2020   Procedure: OPEN REDUCTION INTERNAL FIXATION (ORIF) DISTAL RADIAL FRACTURE;  Surgeon: Hessie Knows, MD;  Location: [facility] ORS;  Service: Orthopedics;  Laterality: Left;  . TONSILLECTOMY      There were no vitals filed for this visit.   Subjective Assessment - 05/01/20 1200    Subjective  I have done like you told me - exercises doing okay - I am wearing my splint when doing something heavy or like putting on my compression -and that helps for the pain -my pain is better    Pertinent History Pt fell on 03/17/2020 when she push out trashcan and did not use her cane - Had ORIF on L distal radius fx on 03/20/2020 by Dr Rudene Christians - seen few times by PA - and refer to OT /hand therapy    Patient Stated Goals Want the pain and motion better in my L wrist and hand - increase strength to be able to use it like I did before with cooking, house work , put on my compression sleeves,  paint , garden and sew  ,    Currently in Pain? Yes    Pain Score 1     Pain Location Wrist    Pain Orientation Left    Pain Descriptors / Indicators Aching;Tightness    Pain Type Surgical pain    Pain Onset More than a month ago    Pain Frequency Intermittent              [facility] OT Assessment - 05/01/20 0001      AROM   Right Forearm Pronation 90 Degrees    Right Forearm Supination 85 Degrees    Left Forearm Pronation 90 Degrees    Left Forearm Supination 80 Degrees    Left Wrist Extension 48 Degrees    Left Wrist Flexion 62 Degrees    Left Wrist Radial Deviation 14 Degrees    Left Wrist Ulnar Deviation 25 Degrees          Measurement taken - see flowsheet- great progress  edema in L wrist improve with 1/2 cm - cont to wear isotoner glove           OT Treatments/Exercises (OP) - 05/01/20 0001      LUE Fluidotherapy   Number Minutes Fluidotherapy 8 Minutes  LUE Fluidotherapy Location Hand;Wrist    Comments Wrist in all planes prior to ROM and scar massage               Scar massage done by OT and focus on middle part that is adhere - used xtractor with wrist flexion , ext AAROM  Cont  cica scar pad for night time use isotoner glove use as much as she can   Review with pt AAROM for wrist flexion , ext, RD, UD over edge of table needed mod A 10 reps hold 5 sec  AROM for RD, UD, flexion , extention need min A   12 reps  2 -3 x day Add prayer stretch and wrist flexion stretch - when want to do something xtra - pain free Tendon glides and opposition  Report numbness in 4th and 5th better-  cont to follow recommendations in ositions to avoid for cubital tunnel and ice to elbow  Keep pain under 2/10        OT Education - 05/01/20 1202    Education Details HEP and progress    Person(s) Educated Patient    Methods Explanation;Demonstration;Tactile cues;Verbal cues;Handout    Comprehension Verbal cues required;Returned demonstration;Verbalized  understanding            OT Short Term Goals - 04/27/20 1246      OT SHORT TERM GOAL #1   Title Pt to be independent in HEP to increase increase AROM , decrease scar adhesion,  pain and edema in L wrist and hand    Baseline no knowledge in HEP    Time 3    Period Weeks    Status New    Target Date 05/18/20      OT SHORT TERM GOAL #2   Title Pt report no sensation changes or numbness in 4thand 5th digits    Baseline cubital tunnel tender and positive Tinel -and pt report 4th and 5th digits numbness    Time 3    Period Weeks    Target Date 05/18/20             OT Long Term Goals - 04/27/20 1248      OT LONG TERM GOAL #1   Title L wrist AROM increase to [facility] for pt to use hand in more than 75% of ADL's and IADLs    Baseline decrease ROM in all planes -see flowsheet and function score on PRWHE 24/50    Time 6    Period Weeks    Status New    Target Date 06/08/20      OT LONG TERM GOAL #2   Title L wrist strength increase for pt to carry more than 8 lbs and push up from chair with no increase symptoms    Baseline pain and edema - and decrease AROM in all planes - 5 1/2 wks s/p    Time 6    Period Weeks    Status New    Target Date 06/08/20      OT LONG TERM GOAL #3   Title L grip and prehension strength increase to more than 75% compare to R to carry 8 lbs, put on compression garments without increase symptoms    Baseline 5 1/2 wks s/p    Time 6    Period Weeks    Status New    Target Date 06/08/20                 Plan - 05/01/20 1203  Clinical Impression Statement Pt is 6 wks s/p L distal radius fx with ORIF - pt show increase AROM of wrist in all planes with doing HEP and decrease pain - scar tissue improving - still adhere in middle - cont to benefit form OT services    OT Occupational Profile and History Problem Focused Assessment - Including review of records relating to presenting problem    Occupational performance deficits (Please refer to  evaluation for details): ADL's;IADL's;Play;Leisure;Social Participation    Body Structure / Function / Physical Skills ADL;Edema;Decreased knowledge of precautions;Flexibility;IADL;Pain;Sensation;Scar mobility;ROM;UE functional use    Rehab Potential Good    Clinical Decision Making Limited treatment options, no task modification necessary    Comorbidities Affecting Occupational Performance: May have comorbidities impacting occupational performance    Modification or Assistance to Complete Evaluation  No modification of tasks or assist necessary to complete eval    OT Frequency 2x / week    OT Duration 6 weeks    OT Treatment/Interventions Self-care/ADL training;Contrast Bath;Fluidtherapy;Paraffin;DME and/or AE instruction;Manual Therapy;Passive range of motion;Scar mobilization;Therapeutic exercise;Manual lymph drainage;Patient/family education;Splinting    Plan assess progress with HEP    OT Home Exercise Plan see pt instructions    Consulted and Agree with Plan of Care Patient           Patient will benefit from skilled therapeutic intervention in order to improve the following deficits and impairments:   Body Structure / Function / Physical Skills: ADL, Edema, Decreased knowledge of precautions, Flexibility, IADL, Pain, Sensation, Scar mobility, ROM, UE functional use       Visit Diagnosis: Pain in left wrist  Stiffness of left wrist, not elsewhere classified  Muscle weakness (generalized)  Scar condition and fibrosis of skin  Lymphedema, not elsewhere classified    Problem List Patient Active Problem List   Diagnosis Date Noted  . Lymphedema 03/21/2018  . Pain in joint, lower leg 02/09/2018  . Exercise-induced asthma 05/04/2017  . 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 Pure hypercholesterolemia 05/04/2017  . Screening for colorectal cancer   . IBS (irritable bowel syndrome) 02/14/2017  . Rectal bleeding 02/14/2017  . Plantar wart 02/14/2017  . Transient global  amnesia 02/12/2016  . Annual physical exam 02/12/2016  . Eczema 12/03/2013  . Benign essential HTN 12/03/2013  . Anxiety 12/03/2013  . Migraine 12/03/2013  . Other closed fractures of distal end of radius (alone) 12/03/2013  . Hereditary and idiopathic peripheral neuropathy 12/03/2013    Rosalyn Gess OTR/L,CLT 05/01/2020, 12:06 PM  [facility] [address] [place], [address] Phone: [phone]   Fax:  [phone]  Name: ARIZONA SORN MRN: [id number] Date of Birth: November 20, 1950

## 2020-05-04 ENCOUNTER — Ambulatory Visit: Payer: PPO | Admitting: Occupational Therapy

## 2020-05-04 ENCOUNTER — Other Ambulatory Visit: Payer: Self-pay

## 2020-05-04 DIAGNOSIS — I890 Lymphedema, not elsewhere classified: Secondary | ICD-10-CM

## 2020-05-04 DIAGNOSIS — L905 Scar conditions and fibrosis of skin: Secondary | ICD-10-CM

## 2020-05-04 DIAGNOSIS — M25532 Pain in left wrist: Secondary | ICD-10-CM | POA: Diagnosis not present

## 2020-05-04 DIAGNOSIS — M6281 Muscle weakness (generalized): Secondary | ICD-10-CM

## 2020-05-04 DIAGNOSIS — M25632 Stiffness of left wrist, not elsewhere classified: Secondary | ICD-10-CM

## 2020-05-04 NOTE — Therapy (Signed)
[place] [facility] [address] [place], [address] Phone: [phone]   Fax:  [phone]  Occupational Therapy Treatment  Patient Details  Name: Summer Bishop MRN: [id number] Date of Birth: 1950/12/01 Referring Provider (OT): Rachelle Hora   Encounter Date: 05/04/2020   OT End of Session - 05/04/20 1025    Visit Number 3    Number of Visits 12    Date for OT Re-Evaluation 06/08/20    OT Start Time 1028    OT Stop Time 1111    OT Time Calculation (min) 43 min    Activity Tolerance Patient tolerated treatment well    Behavior During Therapy [facility] for tasks assessed/performed           Past Medical History:  Diagnosis Date  . Asthma   . Chicken pox   . Headache   . Hypertension   . Lymphedema     Past Surgical History:  Procedure Laterality Date  . ABDOMINAL HYSTERECTOMY    . APPENDECTOMY    . COLONOSCOPY WITH PROPOFOL N/A 03/29/2017   Procedure: COLONOSCOPY WITH PROPOFOL;  Surgeon: Lin Landsman, MD;  Location: [facility] ENDOSCOPY;  Service: Gastroenterology;  Laterality: N/A;  . OPEN REDUCTION INTERNAL FIXATION (ORIF) DISTAL RADIAL FRACTURE Left 03/20/2020   Procedure: OPEN REDUCTION INTERNAL FIXATION (ORIF) DISTAL RADIAL FRACTURE;  Surgeon: Hessie Knows, MD;  Location: [facility] ORS;  Service: Orthopedics;  Laterality: Left;  . TONSILLECTOMY      There were no vitals filed for this visit.   Subjective Assessment - 05/04/20 1112    Subjective  Doing well - I can see progress in my range and using it more - just weak    Pertinent History Pt fell on 03/17/2020 when she push out trashcan and did not use her cane - Had ORIF on L distal radius fx on 03/20/2020 by Dr Rudene Christians - seen few times by PA - and refer to OT /hand therapy    Patient Stated Goals Want the pain and motion better in my L wrist and hand - increase strength to be able to use it like I did before with cooking, house work , put on my compression  sleeves, paint , garden and sew  ,    Currently in Pain? No/denies              [facility] OT Assessment - 05/04/20 0001      AROM   Left Wrist Extension 55 Degrees    Left Wrist Flexion 65 Degrees    Left Wrist Radial Deviation 18 Degrees    Left Wrist Ulnar Deviation 27 Degrees      Strength   Right Hand Grip (lbs) 51    Right Hand Lateral Pinch 17 lbs    Right Hand 3 Point Pinch 16 lbs    Left Hand Grip (lbs) 20    Left Hand Lateral Pinch 9 lbs    Left Hand 3 Point Pinch 6 lbs           Measurement taken - cont to make progress in AROM , scar adhesions          OT Treatments/Exercises (OP) - 05/04/20 0001      LUE Fluidotherapy   Number Minutes Fluidotherapy 8 Minutes    LUE Fluidotherapy Location Hand;Wrist    Comments wrist AROM in all planes               Scar massage done by OT and  focus on middle part that is adhere - used xtractor with wrist flexion , ext AAROM  And graston tool nr 2 for brushing and sweeping gentle  Done kinesiotape this date - 30 % pull parallel and across 3 at 100% pull - to keep on for about 24-48 hrs - except if redness isotoner glove use as much as she can   Review with pt AAROM for wrist flexion , ext, RD, UD over edge of table needed mod A 10 reps hold 5 sec  AROM for RD, UD, flexion , extention need min A   12 reps  2 -3 x day Add prayer stretch and wrist flexion stretch - when want to do something xtra - pain free  Add this date 1 lbs weight in all planes for wrist pain free 12 reps  2 x day And teal putty for gripping , lat and 3 point grip- 12 reps  2 x day pain free  Report numbness in 4th and 5th better-  cont to follow recommendations in ositions to avoid for cubital tunnel and ice to elbow  Keep pain under 2/10       OT Education - 05/04/20 1025    Education Details upgrade HEP and progress    Person(s) Educated Patient    Methods Explanation;Demonstration;Tactile cues;Verbal cues;Handout     Comprehension Verbal cues required;Returned demonstration;Verbalized understanding            OT Short Term Goals - 04/27/20 1246      OT SHORT TERM GOAL #1   Title Pt to be independent in HEP to increase increase AROM , decrease scar adhesion,  pain and edema in L wrist and hand    Baseline no knowledge in HEP    Time 3    Period Weeks    Status New    Target Date 05/18/20      OT SHORT TERM GOAL #2   Title Pt report no sensation changes or numbness in 4thand 5th digits    Baseline cubital tunnel tender and positive Tinel -and pt report 4th and 5th digits numbness    Time 3    Period Weeks    Target Date 05/18/20             OT Long Term Goals - 04/27/20 1248      OT LONG TERM GOAL #1   Title L wrist AROM increase to [facility] for pt to use hand in more than 75% of ADL's and IADLs    Baseline decrease ROM in all planes -see flowsheet and function score on PRWHE 24/50    Time 6    Period Weeks    Status New    Target Date 06/08/20      OT LONG TERM GOAL #2   Title L wrist strength increase for pt to carry more than 8 lbs and push up from chair with no increase symptoms    Baseline pain and edema - and decrease AROM in all planes - 5 1/2 wks s/p    Time 6    Period Weeks    Status New    Target Date 06/08/20      OT LONG TERM GOAL #3   Title L grip and prehension strength increase to more than 75% compare to R to carry 8 lbs, put on compression garments without increase symptoms    Baseline 5 1/2 wks s/p    Time 6    Period Weeks    Status New  Target Date 06/08/20                 Plan - 05/04/20 1026    Clinical Impression Statement Pt is 6 1/2 wks s/p L distal radius fx with ORIF - pt show increase AORM in wrist in all planes. Scar tissue adhesions improving -done some kinesiotaping today and upgrade and add 1 lbs weight for wrist in all planes and med putty - grip and prehrension strength decrease and pain with prehension on volar wrist    OT  Occupational Profile and History Problem Focused Assessment - Including review of records relating to presenting problem    Occupational performance deficits (Please refer to evaluation for details): ADL's;IADL's;Play;Leisure;Social Participation    Body Structure / Function / Physical Skills ADL;Edema;Decreased knowledge of precautions;Flexibility;IADL;Pain;Sensation;Scar mobility;ROM;UE functional use    Rehab Potential Good    Clinical Decision Making Limited treatment options, no task modification necessary    Comorbidities Affecting Occupational Performance: May have comorbidities impacting occupational performance    Modification or Assistance to Complete Evaluation  No modification of tasks or assist necessary to complete eval    OT Frequency 2x / week    OT Duration 6 weeks    OT Treatment/Interventions Self-care/ADL training;Contrast Bath;Fluidtherapy;Paraffin;DME and/or AE instruction;Manual Therapy;Passive range of motion;Scar mobilization;Therapeutic exercise;Manual lymph drainage;Patient/family education;Splinting    Plan assess progress with HEP    OT Home Exercise Plan see pt instructions    Consulted and Agree with Plan of Care Patient           Patient will benefit from skilled therapeutic intervention in order to improve the following deficits and impairments:   Body Structure / Function / Physical Skills: ADL, Edema, Decreased knowledge of precautions, Flexibility, IADL, Pain, Sensation, Scar mobility, ROM, UE functional use       Visit Diagnosis: Pain in left wrist  Stiffness of left wrist, not elsewhere classified  Muscle weakness (generalized)  Scar condition and fibrosis of skin  Lymphedema, not elsewhere classified    Problem List Patient Active Problem List   Diagnosis Date Noted  . Lymphedema 03/21/2018  . Pain in joint, lower leg 02/09/2018  . Exercise-induced asthma 05/04/2017  . 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 Pure  hypercholesterolemia 05/04/2017  . Screening for colorectal cancer   . IBS (irritable bowel syndrome) 02/14/2017  . Rectal bleeding 02/14/2017  . Plantar wart 02/14/2017  . 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 Annual physical exam 02/12/2016  . Eczema 12/03/2013  . Benign essential HTN 12/03/2013  . Anxiety 12/03/2013  . Migraine 12/03/2013  . Other closed fractures of distal end of radius (alone) 12/03/2013  . Hereditary and idiopathic peripheral neuropathy 12/03/2013    Rosalyn Gess OTR/L,CLT 05/04/2020, 11:18 AM  [place] [facility] [address] [place], [address] Phone: [phone]   Fax:  [phone]  Name: KRISTY SCHOMBURG MRN: [id number] Date of Birth: [birth year]

## 2020-05-07 ENCOUNTER — Ambulatory Visit: Payer: PPO | Admitting: Occupational Therapy

## 2020-05-07 ENCOUNTER — Other Ambulatory Visit: Payer: Self-pay

## 2020-05-07 DIAGNOSIS — I890 Lymphedema, not elsewhere classified: Secondary | ICD-10-CM

## 2020-05-07 DIAGNOSIS — M25532 Pain in left wrist: Secondary | ICD-10-CM

## 2020-05-07 DIAGNOSIS — M25632 Stiffness of left wrist, not elsewhere classified: Secondary | ICD-10-CM

## 2020-05-07 DIAGNOSIS — M6281 Muscle weakness (generalized): Secondary | ICD-10-CM

## 2020-05-07 DIAGNOSIS — L905 Scar conditions and fibrosis of skin: Secondary | ICD-10-CM

## 2020-05-07 NOTE — Therapy (Signed)
[place] [facility] [address], [place], [address] Phone: [phone]   Fax:  [phone]  Occupational Therapy Treatment  Patient Details  Name: Summer Bishop MRN: [id number] Date of Birth: January 18, 1951 Referring Provider (OT): Rachelle Hora   Encounter Date: 05/07/2020   OT End of Session - 05/07/20 1320    Visit Number 4    Number of Visits 12    Date for OT Re-Evaluation 06/08/20    OT Start Time 1013    OT Stop Time 1100    OT Time Calculation (min) 47 min    Activity Tolerance Patient tolerated treatment well    Behavior During Therapy [facility] for tasks assessed/performed           Past Medical History:  Diagnosis Date  . Asthma   . Chicken pox   . Headache   . Hypertension   . Lymphedema     Past Surgical History:  Procedure Laterality Date  . ABDOMINAL HYSTERECTOMY    . APPENDECTOMY    . COLONOSCOPY WITH PROPOFOL N/A 03/29/2017   Procedure: COLONOSCOPY WITH PROPOFOL;  Surgeon: Lin Landsman, MD;  Location: [facility] ENDOSCOPY;  Service: Gastroenterology;  Laterality: N/A;  . OPEN REDUCTION INTERNAL FIXATION (ORIF) DISTAL RADIAL FRACTURE Left 03/20/2020   Procedure: OPEN REDUCTION INTERNAL FIXATION (ORIF) DISTAL RADIAL FRACTURE;  Surgeon: Hessie Knows, MD;  Location: [facility] ORS;  Service: Orthopedics;  Laterality: Left;  . TONSILLECTOMY      There were no vitals filed for this visit.   Subjective Assessment - 05/07/20 1016    Subjective  Not doing so great - increase swelling - I know I do have lymphdema in that arm- some light numbness in pinkie and ring finger if swelling increase    Pertinent History Pt fell on 03/17/2020 when she push out trashcan and did not use her cane - Had ORIF on L distal radius fx on 03/20/2020 by Dr Rudene Christians - seen few times by PA - and refer to OT /hand therapy    Patient Stated Goals Want the pain and motion better in my L wrist and hand - increase strength to be able to use  it like I did before with cooking, house work , put on my compression sleeves, paint , garden and sew  ,    Currently in Pain? No/denies              [facility] OT Assessment - 05/07/20 0001      AROM   Left Wrist Extension 50 Degrees    Left Wrist Flexion 70 Degrees    Left Wrist Radial Deviation 20 Degrees    Left Wrist Ulnar Deviation 27 Degrees      Strength   Right Hand Grip (lbs) 51    Right Hand Lateral Pinch 17 lbs    Right Hand 3 Point Pinch 16 lbs    Left Hand Grip (lbs) 24    Left Hand Lateral Pinch 10 lbs    Left Hand 3 Point Pinch 9 lbs           LYMPHEDEMA/ONCOLOGY QUESTIONNAIRE - 05/07/20 0001      Right Upper Extremity Lymphedema   15 cm Proximal to Olecranon Process 35.4 cm    10 cm Proximal to Olecranon Process 34 cm    Olecranon Process 25.2 cm    15 cm Proximal to Ulnar Styloid Process 24 cm      Left Upper Extremity Lymphedema   15  cm Proximal to Olecranon Process 33.5 cm    10 cm Proximal to Olecranon Process 32 cm    Olecranon Process 24.3 cm    15 cm Proximal to Ulnar Styloid Process 23.5 cm    10 cm Proximal to Ulnar Styloid Process 20.3 cm    Just Proximal to Ulnar Styloid Process 16.4 cm    Across Hand at [employer] 17.8 cm    At [place] of 2nd Digit 6 cm    At [facility] of Thumb 6 cm         pt increase in forearm and hand - compare to eval - pt do have history of lymphedema Add this date tubigrip D to hand to elbow over isotoner glove - to use night and as much as she can during day     Scar massagedone by OT and focus on middle part that is adhere - used xtractor with wrist flexion , ext AAROM  And graston tool nr 2 for brushing and sweeping gentle volar and dorsal forearm and wrist Done kinesiotape this date again- 30 % pull parallel and across 3 at 100% pull - to keep on for about 24-48 hrs - except if redness  AAROM for wrist flexion , extover edge of table done min A  10 reps hold 5 sec  Review prayer stretch and wrist flexion  stretch - hold 5 sec - needed min A   1 lbs weight in all planes for wrist pain free 12 reps - increase to 2 sets am and pm  2 x day teal putty for gripping  2 x 12 reps  2 x day lat and 3 point grip- 12 reps  2 x day pain free  Report numbness in 4th and 5th better except if her edema increase  Keep pain under 2/10               OT Education - 05/07/20 1320    Education Details upgrade HEP and progress    Person(s) Educated Patient    Methods Explanation;Demonstration;Tactile cues;Verbal cues;Handout    Comprehension Verbal cues required;Returned demonstration;Verbalized understanding            OT Short Term Goals - 04/27/20 1246      OT SHORT TERM GOAL #1   Title Pt to be independent in HEP to increase increase AROM , decrease scar adhesion,  pain and edema in L wrist and hand    Baseline no knowledge in HEP    Time 3    Period Weeks    Status New    Target Date 05/18/20      OT SHORT TERM GOAL #2   Title Pt report no sensation changes or numbness in 4thand 5th digits    Baseline cubital tunnel tender and positive Tinel -and pt report 4th and 5th digits numbness    Time 3    Period Weeks    Target Date 05/18/20             OT Long Term Goals - 04/27/20 1248      OT LONG TERM GOAL #1   Title L wrist AROM increase to [facility] for pt to use hand in more than 75% of ADL's and IADLs    Baseline decrease ROM in all planes -see flowsheet and function score on PRWHE 24/50    Time 6    Period Weeks    Status New    Target Date 06/08/20      OT  LONG TERM GOAL #2   Title L wrist strength increase for pt to carry more than 8 lbs and push up from chair with no increase symptoms    Baseline pain and edema - and decrease AROM in all planes - 5 1/2 wks s/p    Time 6    Period Weeks    Status New    Target Date 06/08/20      OT LONG TERM GOAL #3   Title L grip and prehension strength increase to more than 75% compare to R to carry 8 lbs, put on compression  garments without increase symptoms    Baseline 5 1/2 wks s/p    Time 6    Period Weeks    Status New    Target Date 06/08/20                 Plan - 05/07/20 1321    Clinical Impression Statement Pt is 7 wks s/p L distal radius fx with ORIF - pt show increase flexion more than extention - scar adhesion improving but still adhere over 2cm area - pt using kinesiotape some - and did show increase edema over hand and forearm with strengthening since last time- pt has diagnosis of lymphedema in bilateral UE and LE - add some compression tubigrip from hand to elbow over glove - cont to increase strength reps and sets    OT Occupational Profile and History Problem Focused Assessment - Including review of records relating to presenting problem    Occupational performance deficits (Please refer to evaluation for details): ADL's;IADL's;Play;Leisure;Social Participation    Body Structure / Function / Physical Skills ADL;Edema;Decreased knowledge of precautions;Flexibility;IADL;Pain;Sensation;Scar mobility;ROM;UE functional use    Rehab Potential Good    Clinical Decision Making Limited treatment options, no task modification necessary    Comorbidities Affecting Occupational Performance: May have comorbidities impacting occupational performance    Modification or Assistance to Complete Evaluation  No modification of tasks or assist necessary to complete eval    OT Frequency 2x / week    OT Duration 6 weeks    OT Treatment/Interventions Self-care/ADL training;Contrast Bath;Fluidtherapy;Paraffin;DME and/or AE instruction;Manual Therapy;Passive range of motion;Scar mobilization;Therapeutic exercise;Manual lymph drainage;Patient/family education;Splinting    Plan assess progress with HEP    OT Home Exercise Plan see pt instructions    Consulted and Agree with Plan of Care Patient           Patient will benefit from skilled therapeutic intervention in order to improve the following deficits and  impairments:   Body Structure / Function / Physical Skills: ADL, Edema, Decreased knowledge of precautions, Flexibility, IADL, Pain, Sensation, Scar mobility, ROM, UE functional use       Visit Diagnosis: Pain in left wrist  Stiffness of left wrist, not elsewhere classified  Muscle weakness (generalized)  Scar condition and fibrosis of skin  Lymphedema, not elsewhere classified    Problem List Patient Active Problem List   Diagnosis Date Noted  . Lymphedema 03/21/2018  . Pain in joint, lower leg 02/09/2018  . Exercise-induced asthma 05/04/2017  . 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 Pure hypercholesterolemia 05/04/2017  . Screening for colorectal cancer   . IBS (irritable bowel syndrome) 02/14/2017  . Rectal bleeding 02/14/2017  . Plantar wart 02/14/2017  . 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 Annual physical exam 02/12/2016  . Eczema 12/03/2013  . Benign essential HTN 12/03/2013  . Anxiety 12/03/2013  . Migraine 12/03/2013  . Other closed fractures of distal end of radius (alone)  12/03/2013  . Hereditary and idiopathic peripheral neuropathy 12/03/2013    Rosalyn Gess OTR/L,CLT 05/07/2020, 1:25 PM  [place] [facility] [address], [place], [address] Phone: [phone]   Fax:  [phone]  Name: Summer Bishop MRN: [id number] Date of Birth: 1950/09/24

## 2020-05-12 ENCOUNTER — Other Ambulatory Visit: Payer: Self-pay

## 2020-05-12 ENCOUNTER — Ambulatory Visit: Payer: PPO | Admitting: Occupational Therapy

## 2020-05-12 DIAGNOSIS — M25532 Pain in left wrist: Secondary | ICD-10-CM

## 2020-05-12 DIAGNOSIS — M25632 Stiffness of left wrist, not elsewhere classified: Secondary | ICD-10-CM

## 2020-05-12 DIAGNOSIS — M6281 Muscle weakness (generalized): Secondary | ICD-10-CM

## 2020-05-12 DIAGNOSIS — I890 Lymphedema, not elsewhere classified: Secondary | ICD-10-CM

## 2020-05-12 DIAGNOSIS — L905 Scar conditions and fibrosis of skin: Secondary | ICD-10-CM

## 2020-05-12 NOTE — Therapy (Signed)
[facility] [facility] [address] [place], [address] Phone: [phone]   Fax:  [phone]  Occupational Therapy Treatment  Patient Details  Name: Summer Bishop MRN: [id number] Date of Birth: [birth year] Referring Provider (OT): Rachelle Hora   Encounter Date: 05/12/2020   OT End of Session - 05/12/20 1120    Visit Number 5    Number of Visits 12    Date for OT Re-Evaluation 06/08/20    OT Start Time 1053    OT Stop Time 1132    OT Time Calculation (min) 39 min    Activity Tolerance Patient tolerated treatment well    Behavior During Therapy [facility] for tasks assessed/performed           Past Medical History:  Diagnosis Date  . Asthma   . Chicken pox   . Headache   . Hypertension   . Lymphedema     Past Surgical History:  Procedure Laterality Date  . ABDOMINAL HYSTERECTOMY    . APPENDECTOMY    . COLONOSCOPY WITH PROPOFOL N/A 03/29/2017   Procedure: COLONOSCOPY WITH PROPOFOL;  Surgeon: Lin Landsman, MD;  Location: [facility] ENDOSCOPY;  Service: Gastroenterology;  Laterality: N/A;  . OPEN REDUCTION INTERNAL FIXATION (ORIF) DISTAL RADIAL FRACTURE Left 03/20/2020   Procedure: OPEN REDUCTION INTERNAL FIXATION (ORIF) DISTAL RADIAL FRACTURE;  Surgeon: Hessie Knows, MD;  Location: [facility] ORS;  Service: Orthopedics;  Laterality: Left;  . TONSILLECTOMY      There were no vitals filed for this visit.   Subjective Assessment - 05/12/20 1059    Subjective  Swellingf better , did my hair even today -no pain - and more strength    Pertinent History Pt fell on 03/17/2020 when she push out trashcan and did not use her cane - Had ORIF on L distal radius fx on 03/20/2020 by Dr Rudene Christians - seen few times by PA - and refer to OT /hand therapy    Patient Stated Goals Want the pain and motion better in my L wrist and hand - increase strength to be able to use it like I did before with cooking, house work , put on my compression sleeves,  paint , garden and sew  ,    Currently in Pain? No/denies              [facility] OT Assessment - 05/12/20 0001      AROM   Left Wrist Extension 55 Degrees    Left Wrist Flexion 75 Degrees      Strength   Left Hand Grip (lbs) 26    Left Hand Lateral Pinch 11 lbs    Left Hand 3 Point Pinch 9 lbs           LYMPHEDEMA/ONCOLOGY QUESTIONNAIRE - 05/12/20 0001      Left Upper Extremity Lymphedema   15 cm Proximal to Ulnar Styloid Process 23.1 cm    10 cm Proximal to Ulnar Styloid Process 20 cm    Just Proximal to Ulnar Styloid Process 16 cm    Across Hand at [employer] 17.3 cm             pt increase in elbow and upper arm - decrease in hand to forearm -compare to last time- pt do have history of lymphedema Add last time tubigrip D to hand to elbow over isotoner glove - to use night and as much as she can during day Add this date tubigrip E to forearm  to axilla  Pt compression sleeves to tight to donn and do not have gloves with it - hand will increase in edema       OT Treatments/Exercises (OP) - 05/12/20 0001      LUE Fluidotherapy   Number Minutes Fluidotherapy 8 Minutes    LUE Fluidotherapy Location Hand;Wrist    Comments wrist AROM in all planes prior to soft tissue and ROM               Scar massagedone by OT and focus on middle part that is adhere - used xtractor with wrist flexion , ext AAROMAnd graston tool nr 2 for brushing and sweeping gentle volar and dorsal forearm and wrist [employer] cica scar pad provided   AAROM for wrist flexion , extover edge of table done min A  10 reps hold 5 sec  Review prayer stretch and wrist flexion stretch - hold 5 sec - needed min A   1 lbs weight in all planes for wrist pain free 12 reps - increase to 3 sets am and pm  2 x day Add 1/2 firm green to teal putty for gripping  3 x 12 reps  2 x day lat and 3 point grip- 12 reps  But keep at 2 sets 2 x day pain free   Keep pain under 2/10        OT  Education - 05/12/20 1120    Education Details upgrade HEP and progress    Person(s) Educated Patient    Methods Explanation;Demonstration;Tactile cues;Verbal cues;Handout    Comprehension Verbal cues required;Returned demonstration;Verbalized understanding            OT Short Term Goals - 04/27/20 1246      OT SHORT TERM GOAL #1   Title Pt to be independent in HEP to increase increase AROM , decrease scar adhesion,  pain and edema in L wrist and hand    Baseline no knowledge in HEP    Time 3    Period Weeks    Status New    Target Date 05/18/20      OT SHORT TERM GOAL #2   Title Pt report no sensation changes or numbness in 4thand 5th digits    Baseline cubital tunnel tender and positive Tinel -and pt report 4th and 5th digits numbness    Time 3    Period Weeks    Target Date 05/18/20             OT Long Term Goals - 04/27/20 1248      OT LONG TERM GOAL #1   Title L wrist AROM increase to [facility] for pt to use hand in more than 75% of ADL's and IADLs    Baseline decrease ROM in all planes -see flowsheet and function score on PRWHE 24/50    Time 6    Period Weeks    Status New    Target Date 06/08/20      OT LONG TERM GOAL #2   Title L wrist strength increase for pt to carry more than 8 lbs and push up from chair with no increase symptoms    Baseline pain and edema - and decrease AROM in all planes - 5 1/2 wks s/p    Time 6    Period Weeks    Status New    Target Date 06/08/20      OT LONG TERM GOAL #3   Title L grip and prehension strength increase to more than  75% compare to R to carry 8 lbs, put on compression garments without increase symptoms    Baseline 5 1/2 wks s/p    Time 6    Period Weeks    Status New    Target Date 06/08/20                 Plan - 05/12/20 1245    Clinical Impression Statement Pt 7 1/2 wks s/p L distal radius fx with ORIF - scar adhesions improving and showing increase flexion , ext at wrist - increase  strength able to  upgrade putty this date and pt to do 3 sets of 1 lbs weight in all planes - did show increase lymphedema at elbow and upper arm with use of isotoner glove and tubigrip D on forearm /hand - apply tubigrip E from forearm to upper arm over forearm and glove    OT Occupational Profile and History Problem Focused Assessment - Including review of records relating to presenting problem    Occupational performance deficits (Please refer to evaluation for details): ADL's;IADL's;Play;Leisure;Social Participation    Body Structure / Function / Physical Skills ADL;Edema;Decreased knowledge of precautions;Flexibility;IADL;Pain;Sensation;Scar mobility;ROM;UE functional use    Rehab Potential Good    Clinical Decision Making Limited treatment options, no task modification necessary    Comorbidities Affecting Occupational Performance: May have comorbidities impacting occupational performance    Modification or Assistance to Complete Evaluation  No modification of tasks or assist necessary to complete eval    OT Frequency 2x / week    OT Duration 6 weeks    OT Treatment/Interventions Self-care/ADL training;Contrast Bath;Fluidtherapy;Paraffin;DME and/or AE instruction;Manual Therapy;Passive range of motion;Scar mobilization;Therapeutic exercise;Manual lymph drainage;Patient/family education;Splinting    Plan assess progress with HEP    OT Home Exercise Plan see pt instructions    Consulted and Agree with Plan of Care Patient           Patient will benefit from skilled therapeutic intervention in order to improve the following deficits and impairments:   Body Structure / Function / Physical Skills: ADL, Edema, Decreased knowledge of precautions, Flexibility, IADL, Pain, Sensation, Scar mobility, ROM, UE functional use       Visit Diagnosis: Stiffness of left wrist, not elsewhere classified  Pain in left wrist  Muscle weakness (generalized)  Scar condition and fibrosis of skin  Lymphedema, not  elsewhere classified    Problem List Patient Active Problem List   Diagnosis Date Noted  . Lymphedema 03/21/2018  . Pain in joint, lower leg 02/09/2018  . Exercise-induced asthma 05/04/2017  . 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 Pure hypercholesterolemia 05/04/2017  . Screening for colorectal cancer   . IBS (irritable bowel syndrome) 02/14/2017  . Rectal bleeding 02/14/2017  . Plantar wart 02/14/2017  . 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 Annual physical exam 02/12/2016  . Eczema 12/03/2013  . Benign essential HTN 12/03/2013  . Anxiety 12/03/2013  . Migraine 12/03/2013  . Other closed fractures of distal end of radius (alone) 12/03/2013  . Hereditary and idiopathic peripheral neuropathy 12/03/2013    Rosalyn Gess OTR/l,CLT 05/12/2020, 12:48 PM  [facility] [address], [place], [address] Phone: [phone]   Fax:  [phone]  Name: AMAURI MEDELLIN MRN: [id number] Date of Birth: 1950/09/26

## 2020-05-15 ENCOUNTER — Ambulatory Visit: Payer: PPO | Admitting: Occupational Therapy

## 2020-05-15 ENCOUNTER — Other Ambulatory Visit: Payer: Self-pay

## 2020-05-15 DIAGNOSIS — M25532 Pain in left wrist: Secondary | ICD-10-CM | POA: Diagnosis not present

## 2020-05-15 DIAGNOSIS — M6281 Muscle weakness (generalized): Secondary | ICD-10-CM

## 2020-05-15 DIAGNOSIS — M25632 Stiffness of left wrist, not elsewhere classified: Secondary | ICD-10-CM

## 2020-05-15 DIAGNOSIS — L905 Scar conditions and fibrosis of skin: Secondary | ICD-10-CM

## 2020-05-15 DIAGNOSIS — I890 Lymphedema, not elsewhere classified: Secondary | ICD-10-CM

## 2020-05-15 NOTE — Therapy (Signed)
[place] [facility] [address] [place], [address] Phone: [phone]   Fax:  [phone]  Occupational Therapy Treatment  Patient Details  Name: Summer Bishop MRN: [id number] Date of Birth: 1951/02/02 Referring Provider (OT): Rachelle Hora   Encounter Date: 05/15/2020   OT End of Session - 05/15/20 1058    Visit Number 6    Number of Visits 12    Date for OT Re-Evaluation 06/08/20    OT Start Time 1043    OT Stop Time 1125    OT Time Calculation (min) 42 min    Activity Tolerance Patient tolerated treatment well    Behavior During Therapy [facility] for tasks assessed/performed           Past Medical History:  Diagnosis Date  . Asthma   . Chicken pox   . Headache   . Hypertension   . Lymphedema     Past Surgical History:  Procedure Laterality Date  . ABDOMINAL HYSTERECTOMY    . APPENDECTOMY    . COLONOSCOPY WITH PROPOFOL N/A 03/29/2017   Procedure: COLONOSCOPY WITH PROPOFOL;  Surgeon: Lin Landsman, MD;  Location: [facility] ENDOSCOPY;  Service: Gastroenterology;  Laterality: N/A;  . OPEN REDUCTION INTERNAL FIXATION (ORIF) DISTAL RADIAL FRACTURE Left 03/20/2020   Procedure: OPEN REDUCTION INTERNAL FIXATION (ORIF) DISTAL RADIAL FRACTURE;  Surgeon: Hessie Knows, MD;  Location: [facility] ORS;  Service: Orthopedics;  Laterality: Left;  . TONSILLECTOMY      There were no vitals filed for this visit.   Subjective Assessment - 05/15/20 1046    Subjective  The ones for my thumb on the putty - I had to back off -on the 2nd day - weight for wrist and putty for grip did good    Pertinent History Pt fell on 03/17/2020 when she push out trashcan and did not use her cane - Had ORIF on L distal radius fx on 03/20/2020 by Dr Rudene Christians - seen few times by PA - and refer to OT /hand therapy    Patient Stated Goals Want the pain and motion better in my L wrist and hand - increase strength to be able to use it like I did before with  cooking, house work , put on my compression sleeves, paint , garden and sew  ,    Currently in Pain? Yes    Pain Score 2     Pain Location Hand   L thumb CMC   Pain Orientation Left    Pain Descriptors / Indicators Aching    Pain Type Surgical pain;Chronic pain    Pain Onset More than a month ago    Pain Frequency Intermittent              [facility] OT Assessment - 05/15/20 0001      AROM   Left Wrist Extension 58 Degrees    Left Wrist Flexion 75 Degrees    Left Wrist Radial Deviation 20 Degrees    Left Wrist Ulnar Deviation 27 Degrees           LYMPHEDEMA/ONCOLOGY QUESTIONNAIRE - 05/15/20 0001      Left Upper Extremity Lymphedema   15 cm Proximal to Olecranon Process 34 cm    10 cm Proximal to Olecranon Process 32 cm    Olecranon Process 23.8 cm    15 cm Proximal to Ulnar Styloid Process 23 cm    10 cm Proximal to Ulnar Styloid Process 20 cm    Just  Proximal to Ulnar Styloid Process 16 cm            Pt containing her lymphedema in L UE with tubigrip D from hand to elbow over isotoner glove - to use night and as much as she can during day Add last time tubigrip E from forearm to axilla           OT Treatments/Exercises (OP) - 05/15/20 0001      LUE Fluidotherapy   Number Minutes Fluidotherapy 8 Minutes    LUE Fluidotherapy Location Hand;Wrist    Comments Wrist AROM in all planes              Scar massagedone by OT and focus on middle part that is adhere still - used xtractor with wrist flexion , ext AAROMAnd graston tool nr 2 for brushing and sweeping gentlevolar and dorsal forearm and wrist   AAROM for wrist flexion , extover edge of table 10 reps hold 5 sec Reviewprayer stretch and wrist flexion stretch- hold 5 sec - needed min A  Upgrade to 2  lbs weight in all planes for wrist pain free 12 reps-but decrease to 1 set  2 x day And if no pain increase in 3 days to 2 sets - 2 x day  Cont with same putty 1/2 firm green to teal putty  for gripping3 x 12 reps 2 x day lat and 3 point grip- 12 reps  only 1 set of CMC pain is better  2 x day pain free        OT Education - 05/15/20 1058    Education Details upgrade HEP and progress    Person(s) Educated Patient    Methods Explanation;Demonstration;Tactile cues;Verbal cues;Handout    Comprehension Verbal cues required;Returned demonstration;Verbalized understanding            OT Short Term Goals - 04/27/20 1246      OT SHORT TERM GOAL #1   Title Pt to be independent in HEP to increase increase AROM , decrease scar adhesion,  pain and edema in L wrist and hand    Baseline no knowledge in HEP    Time 3    Period Weeks    Status New    Target Date 05/18/20      OT SHORT TERM GOAL #2   Title Pt report no sensation changes or numbness in 4thand 5th digits    Baseline cubital tunnel tender and positive Tinel -and pt report 4th and 5th digits numbness    Time 3    Period Weeks    Target Date 05/18/20             OT Long Term Goals - 04/27/20 1248      OT LONG TERM GOAL #1   Title L wrist AROM increase to [facility] for pt to use hand in more than 75% of ADL's and IADLs    Baseline decrease ROM in all planes -see flowsheet and function score on PRWHE 24/50    Time 6    Period Weeks    Status New    Target Date 06/08/20      OT LONG TERM GOAL #2   Title L wrist strength increase for pt to carry more than 8 lbs and push up from chair with no increase symptoms    Baseline pain and edema - and decrease AROM in all planes - 5 1/2 wks s/p    Time 6    Period Weeks  Status New    Target Date 06/08/20      OT LONG TERM GOAL #3   Title L grip and prehension strength increase to more than 75% compare to R to carry 8 lbs, put on compression garments without increase symptoms    Baseline 5 1/2 wks s/p    Time 6    Period Weeks    Status New    Target Date 06/08/20                 Plan - 05/15/20 1059    Clinical Impression Statement Pt is 8 wks  s/p L distal radius fx with ORIF - pt show increase strength in wrist- able to upgrade to 2lbs weight this date- show improve scar tissue and wrist extention - pt to focus on wrist flexion , ext - strengthening -but hold off on putty on lat and 3 point grip- has thumb CMC arthritis -    OT Occupational Profile and History Problem Focused Assessment - Including review of records relating to presenting problem    Occupational performance deficits (Please refer to evaluation for details): ADL's;IADL's;Play;Leisure;Social Participation    Body Structure / Function / Physical Skills ADL;Edema;Decreased knowledge of precautions;Flexibility;IADL;Pain;Sensation;Scar mobility;ROM;UE functional use    Rehab Potential Good    Clinical Decision Making Limited treatment options, no task modification necessary    Comorbidities Affecting Occupational Performance: May have comorbidities impacting occupational performance    Modification or Assistance to Complete Evaluation  No modification of tasks or assist necessary to complete eval    OT Frequency 2x / week    OT Duration 6 weeks    OT Treatment/Interventions Self-care/ADL training;Contrast Bath;Fluidtherapy;Paraffin;DME and/or AE instruction;Manual Therapy;Passive range of motion;Scar mobilization;Therapeutic exercise;Manual lymph drainage;Patient/family education;Splinting    Plan assess progress with HEP    OT Home Exercise Plan see pt instructions    Consulted and Agree with Plan of Care Patient           Patient will benefit from skilled therapeutic intervention in order to improve the following deficits and impairments:   Body Structure / Function / Physical Skills: ADL, Edema, Decreased knowledge of precautions, Flexibility, IADL, Pain, Sensation, Scar mobility, ROM, UE functional use       Visit Diagnosis: Pain in left wrist  Muscle weakness (generalized)  Scar condition and fibrosis of skin  Lymphedema, not elsewhere  classified  Stiffness of left wrist, not elsewhere classified    Problem List Patient Active Problem List   Diagnosis Date Noted  . Lymphedema 03/21/2018  . Pain in joint, lower leg 02/09/2018  . Exercise-induced asthma 05/04/2017  . 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 Pure hypercholesterolemia 05/04/2017  . Screening for colorectal cancer   . IBS (irritable bowel syndrome) 02/14/2017  . Rectal bleeding 02/14/2017  . Plantar wart 02/14/2017  . 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 Annual physical exam 02/12/2016  . Eczema 12/03/2013  . Benign essential HTN 12/03/2013  . Anxiety 12/03/2013  . Migraine 12/03/2013  . Other closed fractures of distal end of radius (alone) 12/03/2013  . Hereditary and idiopathic peripheral neuropathy 12/03/2013    Rosalyn Gess OTR/L,CLT 05/15/2020, 11:32 AM  [place] [facility] [address], [place], [address] Phone: [phone]   Fax:  [phone]  Name: DUCHESS ARMENDAREZ MRN: [id number] Date of Birth: 09/08/1950

## 2020-05-21 ENCOUNTER — Other Ambulatory Visit: Payer: Self-pay

## 2020-05-21 ENCOUNTER — Ambulatory Visit: Payer: PPO | Admitting: Occupational Therapy

## 2020-05-21 DIAGNOSIS — M25532 Pain in left wrist: Secondary | ICD-10-CM

## 2020-05-21 DIAGNOSIS — M25632 Stiffness of left wrist, not elsewhere classified: Secondary | ICD-10-CM

## 2020-05-21 DIAGNOSIS — I890 Lymphedema, not elsewhere classified: Secondary | ICD-10-CM

## 2020-05-21 DIAGNOSIS — M6281 Muscle weakness (generalized): Secondary | ICD-10-CM

## 2020-05-21 DIAGNOSIS — L905 Scar conditions and fibrosis of skin: Secondary | ICD-10-CM

## 2020-05-21 NOTE — Therapy (Signed)
[place] [facility] [address] [place], [address] Phone: [phone]   Fax:  [phone]  Occupational Therapy Treatment  Patient Details  Name: Summer Bishop MRN: [id number] Date of Birth: 06-Apr-1951 Referring Provider (OT): Rachelle Hora   Encounter Date: 05/21/2020   OT End of Session - 05/21/20 1035    Visit Number 7    Number of Visits 12    Date for OT Re-Evaluation 06/08/20    OT Start Time 1030    OT Stop Time 1118    OT Time Calculation (min) 48 min    Activity Tolerance Patient tolerated treatment well    Behavior During Therapy [facility] for tasks assessed/performed           Past Medical History:  Diagnosis Date  . Asthma   . Chicken pox   . Headache   . Hypertension   . Lymphedema     Past Surgical History:  Procedure Laterality Date  . ABDOMINAL HYSTERECTOMY    . APPENDECTOMY    . COLONOSCOPY WITH PROPOFOL N/A 03/29/2017   Procedure: COLONOSCOPY WITH PROPOFOL;  Surgeon: Lin Landsman, MD;  Location: [facility] ENDOSCOPY;  Service: Gastroenterology;  Laterality: N/A;  . OPEN REDUCTION INTERNAL FIXATION (ORIF) DISTAL RADIAL FRACTURE Left 03/20/2020   Procedure: OPEN REDUCTION INTERNAL FIXATION (ORIF) DISTAL RADIAL FRACTURE;  Surgeon: Hessie Knows, MD;  Location: [facility] ORS;  Service: Orthopedics;  Laterality: Left;  . TONSILLECTOMY      There were no vitals filed for this visit.   Subjective Assessment - 05/21/20 1033    Subjective  Last Saturday my wrist had increase pain - and I held off on the exercises -but since Sunday did not had issues doing 2 lbs - 2 x day - putty doing okay    Pertinent History Pt fell on 03/17/2020 when she push out trashcan and did not use her cane - Had ORIF on L distal radius fx on 03/20/2020 by Dr Rudene Christians - seen few times by PA - and refer to OT /hand therapy    Patient Stated Goals Want the pain and motion better in my L wrist and hand - increase strength to be able to  use it like I did before with cooking, house work , put on my compression sleeves, paint , garden and sew  ,    Currently in Pain? Yes    Pain Score 2     Pain Location Arm   feels swollen   Pain Orientation Left    Pain Descriptors / Indicators Aching    Pain Type Surgical pain    Pain Frequency Intermittent              [facility] OT Assessment - 05/21/20 0001      AROM   Left Wrist Extension 60 Degrees    Left Wrist Flexion 70 Degrees      Strength   Right Hand Grip (lbs) 51    Right Hand Lateral Pinch 17 lbs    Right Hand 3 Point Pinch 16 lbs    Left Hand Grip (lbs) 26    Left Hand Lateral Pinch 12 lbs    Left Hand 3 Point Pinch 10 lbs           LYMPHEDEMA/ONCOLOGY QUESTIONNAIRE - 05/21/20 0001      Left Upper Extremity Lymphedema   15 cm Proximal to Olecranon Process 34 cm    10 cm Proximal to Olecranon Process 32 cm  Olecranon Process 23.8 cm    15 cm Proximal to Ulnar Styloid Process 23 cm    10 cm Proximal to Ulnar Styloid Process 20 cm    Just Proximal to Ulnar Styloid Process 16 cm            Pt lymphedema circumference same as last time   all thought pt report it was bad over the weekend - and yesterday- Pt cont to wear isotoner glove, tubigrip D on hand to elbow and tubigrip E from forearm to axilla  Replace today for daytime and night time          OT Treatments/Exercises (OP) - 05/21/20 0001      LUE Fluidotherapy   Number Minutes Fluidotherapy 8 Minutes    LUE Fluidotherapy Location Hand;Wrist    Comments wrist AROM in all planes            Scar massagedone by OT and focus on middle part that is adhere still - used xtractor with wrist flexion , ext AAROMAnd graston tool nr 2 for brushing and sweeping gentlevolar and dorsal forearm and wrist - improving    AAROM for wrist flexion , extover edge of table 10 reps hold 5 sec And add table slides for 20 reps - slight pull- add to HEP - pt tolerate well  Tolerate 2  lbs  weight in all planes for wrist pain free 12 reps 2 sets done - pt can cont with 2 sets- 2 x day and then if pain free can do in 3 days increase to 3 sets - 2 x day   Cont with same putty 1/2 firm green toteal putty for gripping3x 12 reps 2 x day lat and 3 point grip- 12 repsonly 1 set of CMC pain is better  2 x day pain free        OT Education - 05/21/20 1035    Education Details upgrade HEP and progress    Person(s) Educated Patient    Methods Explanation;Demonstration;Tactile cues;Verbal cues;Handout    Comprehension Verbal cues required;Returned demonstration;Verbalized understanding            OT Short Term Goals - 04/27/20 1246      OT SHORT TERM GOAL #1   Title Pt to be independent in HEP to increase increase AROM , decrease scar adhesion,  pain and edema in L wrist and hand    Baseline no knowledge in HEP    Time 3    Period Weeks    Status New    Target Date 05/18/20      OT SHORT TERM GOAL #2   Title Pt report no sensation changes or numbness in 4thand 5th digits    Baseline cubital tunnel tender and positive Tinel -and pt report 4th and 5th digits numbness    Time 3    Period Weeks    Target Date 05/18/20             OT Long Term Goals - 04/27/20 1248      OT LONG TERM GOAL #1   Title L wrist AROM increase to [facility] for pt to use hand in more than 75% of ADL's and IADLs    Baseline decrease ROM in all planes -see flowsheet and function score on PRWHE 24/50    Time 6    Period Weeks    Status New    Target Date 06/08/20      OT LONG TERM GOAL #2   Title L wrist  strength increase for pt to carry more than 8 lbs and push up from chair with no increase symptoms    Baseline pain and edema - and decrease AROM in all planes - 5 1/2 wks s/p    Time 6    Period Weeks    Status New    Target Date 06/08/20      OT LONG TERM GOAL #3   Title L grip and prehension strength increase to more than 75% compare to R to carry 8 lbs, put on compression  garments without increase symptoms    Baseline 5 1/2 wks s/p    Time 6    Period Weeks    Status New    Target Date 06/08/20                 Plan - 05/21/20 1036    Clinical Impression Statement Pt is 9 wks s/p L distal radius fx with ORIF - pt show increase AROM, increase strength and able to tolerate the last few days 2 lbs for wrist -and increase putty resistance - cont to increase weight , reps and sets - and increase functional use    OT Occupational Profile and History Problem Focused Assessment - Including review of records relating to presenting problem    Occupational performance deficits (Please refer to evaluation for details): ADL's;IADL's;Play;Leisure;Social Participation    Body Structure / Function / Physical Skills ADL;Edema;Decreased knowledge of precautions;Flexibility;IADL;Pain;Sensation;Scar mobility;ROM;UE functional use    Rehab Potential Good    Clinical Decision Making Limited treatment options, no task modification necessary    Comorbidities Affecting Occupational Performance: May have comorbidities impacting occupational performance    Modification or Assistance to Complete Evaluation  No modification of tasks or assist necessary to complete eval    OT Frequency 2x / week    OT Duration 6 weeks    OT Treatment/Interventions Self-care/ADL training;Contrast Bath;Fluidtherapy;Paraffin;DME and/or AE instruction;Manual Therapy;Passive range of motion;Scar mobilization;Therapeutic exercise;Manual lymph drainage;Patient/family education;Splinting    Plan assess progress with HEP    OT Home Exercise Plan see pt instructions    Consulted and Agree with Plan of Care Patient           Patient will benefit from skilled therapeutic intervention in order to improve the following deficits and impairments:   Body Structure / Function / Physical Skills: ADL, Edema, Decreased knowledge of precautions, Flexibility, IADL, Pain, Sensation, Scar mobility, ROM, UE functional  use       Visit Diagnosis: Pain in left wrist  Muscle weakness (generalized)  Scar condition and fibrosis of skin  Lymphedema, not elsewhere classified  Stiffness of left wrist, not elsewhere classified    Problem List Patient Active Problem List   Diagnosis Date Noted  . Lymphedema 03/21/2018  . Pain in joint, lower leg 02/09/2018  . Exercise-induced asthma 05/04/2017  . 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 Pure hypercholesterolemia 05/04/2017  . Screening for colorectal cancer   . IBS (irritable bowel syndrome) 02/14/2017  . Rectal bleeding 02/14/2017  . Plantar wart 02/14/2017  . 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 Annual physical exam 02/12/2016  . Eczema 12/03/2013  . Benign essential HTN 12/03/2013  . Anxiety 12/03/2013  . Migraine 12/03/2013  . Other closed fractures of distal end of radius (alone) 12/03/2013  . Hereditary and idiopathic peripheral neuropathy 12/03/2013    Rosalyn Gess OTR/L,CLT 05/21/2020, 12:02 PM  [place] [facility] [address], [place], [address] Phone: [phone]  Fax:  [phone]  Name: Summer Bishop MRN: [id number] Date of Birth: September 16, 1950

## 2020-05-26 ENCOUNTER — Other Ambulatory Visit: Payer: Self-pay

## 2020-05-26 ENCOUNTER — Ambulatory Visit: Payer: PPO | Attending: Orthopedic Surgery | Admitting: Occupational Therapy

## 2020-05-26 DIAGNOSIS — I890 Lymphedema, not elsewhere classified: Secondary | ICD-10-CM | POA: Insufficient documentation

## 2020-05-26 DIAGNOSIS — M25532 Pain in left wrist: Secondary | ICD-10-CM | POA: Diagnosis not present

## 2020-05-26 DIAGNOSIS — M6281 Muscle weakness (generalized): Secondary | ICD-10-CM | POA: Insufficient documentation

## 2020-05-26 DIAGNOSIS — M25632 Stiffness of left wrist, not elsewhere classified: Secondary | ICD-10-CM | POA: Insufficient documentation

## 2020-05-26 DIAGNOSIS — L905 Scar conditions and fibrosis of skin: Secondary | ICD-10-CM | POA: Insufficient documentation

## 2020-05-26 NOTE — Therapy (Signed)
[place] [facility] [address] [place], [address] Phone: [phone]   Fax:  [phone]  Occupational Therapy Treatment  Patient Details  Name: Summer Bishop MRN: [id number] Date of Birth: 1951-03-29 Referring Provider (OT): Rachelle Hora   Encounter Date: 05/26/2020   OT End of Session - 05/26/20 1142    Visit Number 8    Number of Visits 12    Date for OT Re-Evaluation 06/08/20    OT Start Time 1000    OT Stop Time 1046    OT Time Calculation (min) 46 min    Activity Tolerance Patient tolerated treatment well    Behavior During Therapy [facility] for tasks assessed/performed           Past Medical History:  Diagnosis Date  . Asthma   . Chicken pox   . Headache   . Hypertension   . Lymphedema     Past Surgical History:  Procedure Laterality Date  . ABDOMINAL HYSTERECTOMY    . APPENDECTOMY    . COLONOSCOPY WITH PROPOFOL N/A 03/29/2017   Procedure: COLONOSCOPY WITH PROPOFOL;  Surgeon: Lin Landsman, MD;  Location: [facility] ENDOSCOPY;  Service: Gastroenterology;  Laterality: N/A;  . OPEN REDUCTION INTERNAL FIXATION (ORIF) DISTAL RADIAL FRACTURE Left 03/20/2020   Procedure: OPEN REDUCTION INTERNAL FIXATION (ORIF) DISTAL RADIAL FRACTURE;  Surgeon: Hessie Knows, MD;  Location: [facility] ORS;  Service: Orthopedics;  Laterality: Left;  . TONSILLECTOMY      There were no vitals filed for this visit.   Subjective Assessment - 05/26/20 1005    Subjective  I was able to increase to 3 sets with 2 lbs - I need new scar pad for night time- My hand and wrist getting stronger - amaze what I can do now- but cannot think what it was that I was able to do    Pertinent History Pt fell on 03/17/2020 when she push out trashcan and did not use her cane - Had ORIF on L distal radius fx on 03/20/2020 by Dr Rudene Christians - seen few times by PA - and refer to OT /hand therapy    Patient Stated Goals Want the pain and motion better in my L wrist  and hand - increase strength to be able to use it like I did before with cooking, house work , put on my compression sleeves, paint , garden and sew  ,    Currently in Pain? 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          OT Treatments/Exercises (OP) - 05/26/20 0001      LUE Fluidotherapy   Number Minutes Fluidotherapy 8 Minutes    LUE Fluidotherapy Location Hand;Wrist    Comments wrist AROM ext, flexion  Scar massagedone by OT and focus on middle part that is adherestill- used xtractor with wrist  PROM extention  And graston tool nr 2 for brushing and sweeping gentlevolar and dorsal forearm and wrist - improving    AAROM for wrist flexion , extover armrest by OT  10 reps hold 5 sec Review table slides for 20 reps - slight pull pt to use lower table at home Add wall push ups this date - 12 reps- can increase over the next 2 wks another set a week  Tolerate 2lbs weight in all planes for wrist pain free 12 reps  3 sets - 2 x day   Increase putty to firm green - 12 reps 3 sets for gripping -  2 x day lat and 3 point grip- 12 repsonly 1 set of CMC pain is better 2 x day pain free pt is going to be out of town for 3 wks- can cont with HEP   Did provided her with new compression tubigrip - D and E for forearm and upper arm -over isotoner glove        OT Education - 05/26/20 1142    Education Details changes to HEP and progress    Person(s) Educated Patient    Methods Explanation;Demonstration;Tactile cues;Verbal  cues;Handout    Comprehension Verbal cues required;Returned demonstration;Verbalized understanding            OT Short Term Goals - 04/27/20 1246      OT SHORT TERM GOAL #1   Title Pt to be independent in HEP to increase increase AROM , decrease scar adhesion,  pain and edema in L wrist and hand    Baseline no knowledge in HEP    Time 3    Period Weeks    Status New    Target Date 05/18/20      OT SHORT TERM GOAL #2   Title Pt report no sensation changes or numbness in 4thand 5th digits    Baseline cubital tunnel tender and positive Tinel -and pt report 4th and 5th digits numbness    Time 3    Period Weeks    Target Date 05/18/20             OT Long Term Goals - 04/27/20 1248      OT LONG TERM GOAL #1   Title L wrist AROM increase to [facility] for pt to use hand in more than 75% of ADL's and IADLs    Baseline decrease ROM in all planes -see flowsheet and function score on PRWHE 24/50    Time 6    Period Weeks    Status New    Target Date 06/08/20      OT LONG TERM GOAL #2   Title L wrist strength increase for pt to carry more than 8 lbs and push up from chair with no increase symptoms    Baseline pain and edema - and decrease AROM in all planes - 5 1/2 wks s/p    Time 6    Period Weeks    Status New    Target Date 06/08/20      OT LONG TERM GOAL #3   Title L grip and prehension strength increase to more than 75% compare to R to carry 8 lbs, put on compression garments without increase symptoms    Baseline 5 1/2 wks s/p    Time 6    Period Weeks    Status New    Target Date 06/08/20  Plan - 05/26/20 1143    Clinical Impression Statement Pt is 10 wks s/p L distal radius fx with ORIF -pt to cont to work on end range R wrist flexion , extention - able to take some weight thru palm for extention slides on table and intiate this date wall pushups - cont to show increase grip strength - did increase putty resistance - and    OT Occupational Profile  and History Problem Focused Assessment - Including review of records relating to presenting problem    Occupational performance deficits (Please refer to evaluation for details): ADL's;IADL's;Play;Leisure;Social Participation    Body Structure / Function / Physical Skills ADL;Edema;Decreased knowledge of precautions;Flexibility;IADL;Pain;Sensation;Scar mobility;ROM;UE functional use    Rehab Potential Good    Clinical Decision Making Limited treatment options, no task modification necessary    Comorbidities Affecting Occupational Performance: May have comorbidities impacting occupational performance    Modification or Assistance to Complete Evaluation  No modification of tasks or assist necessary to complete eval    OT Frequency 2x / week    OT Duration 6 weeks    OT Treatment/Interventions Self-care/ADL training;Contrast Bath;Fluidtherapy;Paraffin;DME and/or AE instruction;Manual Therapy;Passive range of motion;Scar mobilization;Therapeutic exercise;Manual lymph drainage;Patient/family education;Splinting    Plan assess progress with HEP    OT Home Exercise Plan see pt instructions    Consulted and Agree with Plan of Care Patient           Patient will benefit from skilled therapeutic intervention in order to improve the following deficits and impairments:   Body Structure / Function / Physical Skills: ADL, Edema, Decreased knowledge of precautions, Flexibility, IADL, Pain, Sensation, Scar mobility, ROM, UE functional use       Visit Diagnosis: Pain in left wrist  Muscle weakness (generalized)  Scar condition and fibrosis of skin  Lymphedema, not elsewhere classified  Stiffness of left wrist, not elsewhere classified    Problem List Patient Active Problem List   Diagnosis Date Noted  . Lymphedema 03/21/2018  . Pain in joint, lower leg 02/09/2018  . Exercise-induced asthma 05/04/2017  . 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 Pure hypercholesterolemia 05/04/2017  .  Screening for colorectal cancer   . IBS (irritable bowel syndrome) 02/14/2017  . Rectal bleeding 02/14/2017  . Plantar wart 02/14/2017  . 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 Annual physical exam 02/12/2016  . Eczema 12/03/2013  . Benign essential HTN 12/03/2013  . Anxiety 12/03/2013  . Migraine 12/03/2013  . Other closed fractures of distal end of radius (alone) 12/03/2013  . Hereditary and idiopathic peripheral neuropathy 12/03/2013    Rosalyn Gess OTR/l,CLT 05/26/2020, 11:55 AM  [place] [facility] [address] [place], [address] Phone: [phone]   Fax:  [phone]  Name: SANDE PICKERT MRN: [id number] Date of Birth: 1951/04/13

## 2020-06-16 ENCOUNTER — Ambulatory Visit: Payer: PPO | Admitting: Occupational Therapy

## 2020-06-16 DIAGNOSIS — L309 Dermatitis, unspecified: Secondary | ICD-10-CM | POA: Diagnosis not present

## 2020-06-16 DIAGNOSIS — J069 Acute upper respiratory infection, unspecified: Secondary | ICD-10-CM | POA: Diagnosis not present

## 2020-06-24 DIAGNOSIS — Z8781 Personal history of (healed) traumatic fracture: Secondary | ICD-10-CM | POA: Diagnosis not present

## 2020-06-24 DIAGNOSIS — Z9889 Other specified postprocedural states: Secondary | ICD-10-CM | POA: Diagnosis not present

## 2020-06-24 DIAGNOSIS — S52572S Other intraarticular fracture of lower end of left radius, sequela: Secondary | ICD-10-CM | POA: Diagnosis not present

## 2020-08-04 ENCOUNTER — Ambulatory Visit (INDEPENDENT_AMBULATORY_CARE_PROVIDER_SITE_OTHER): Payer: PPO | Admitting: Vascular Surgery

## 2020-08-04 ENCOUNTER — Encounter (INDEPENDENT_AMBULATORY_CARE_PROVIDER_SITE_OTHER): Payer: Self-pay | Admitting: Vascular Surgery

## 2020-08-04 ENCOUNTER — Other Ambulatory Visit: Payer: Self-pay

## 2020-08-04 VITALS — BP 154/88 | HR 85 | Resp 16 | Wt 167.0 lb

## 2020-08-04 DIAGNOSIS — I890 Lymphedema, not elsewhere classified: Secondary | ICD-10-CM

## 2020-08-04 DIAGNOSIS — I10 Essential (primary) hypertension: Secondary | ICD-10-CM

## 2020-08-04 NOTE — Patient Instructions (Signed)
Lymphedema  Lymphedema is swelling that is caused by the abnormal collection of lymph in the tissues under the skin. Lymph is excess fluid from the tissues in your body that is removed through the lymphatic system. This system is part of your body's defense system (immune system) and includes lymph nodes and lymph vessels. The lymph vessels collect and carry the excess fluid, fats, proteins, and waste from the tissues of the body to the bloodstream. This system also works to clean and remove bacteria and waste products from the body. Lymphedema occurs when the lymphatic system is blocked. When the lymph vessels or lymph nodes are blocked or damaged, lymph does not drain properly. This causes an abnormal buildup of lymph, which leads to swelling in the affected area. This may include the trunk area, or an arm or leg. Lymphedema cannot be cured by medicines, but various methods can be used to help reduce the swelling. What are the causes? The cause of this condition depends on the type of lymphedema that you have.  Primary lymphedema is caused by the absence of lymph vessels or having abnormal lymph vessels at birth.  Secondary lymphedema occurs when lymph vessels are blocked or damaged. Secondary lymphedema is more common. Common causes of lymph vessel blockage include: ? Skin infection, such as cellulitis. ? Infection by parasites (filariasis). ? Injury. ? Radiation therapy. ? Cancer. ? Formation of scar tissue. ? Surgery. What are the signs or symptoms? Symptoms of this condition include:  Swelling of the arm or leg.  A heavy or tight feeling in the arm or leg.  Swelling of the feet, toes, or fingers. Shoes or rings may fit more tightly than before.  Redness of the skin over the affected area.  Limited movement of the affected limb.  Sensitivity to touch or discomfort in the affected limb. How is this diagnosed? This condition may be diagnosed based on:  Your symptoms and medical  history.  A physical exam.  Bioimpedance spectroscopy. In this test, painless electrical currents are used to measure fluid levels in your body.  Imaging tests, such as: ? MRI. ? CT scan. ? Duplex ultrasound. This test uses sound waves to produce images of the vessels and the blood flow on a screen. ? Lymphoscintigraphy. In this test, a low dose of a radioactive substance is injected to trace the flow of lymph through your lymph vessels. ? Lymphangiography. In this test, a contrast dye is injected into the lymph vessel to help show blockages. How is this treated? If an underlying condition is causing the lymphedema, that condition will be treated. For example, antibiotic medicines may be used to treat an infection. Treatment for this condition will depend on the cause of your lymphedema. Treatment may include:  Complete decongestive therapy (CDT). This is done by a certified lymphedema therapist to reduce fluid congestion. This therapy includes: ? Skin care. ? Compression wrapping of the affected area. ? Manual lymph drainage. This is a special massage technique that promotes lymph drainage out of a limb. ? Specific exercises. Certain exercises can help fluid move out of the affected limb.  Compression. Various methods may be used to apply pressure to the affected limb to reduce the swelling. They include: ? Wearing compression stockings or sleeves on the affected limb. ? Wrapping the affected limb with special bandages.  Surgery. This is usually done for severe cases only. For example, surgery may be done if you have trouble moving the limb or if the swelling does not  get better with other treatments.   Follow these instructions at home: Self-care  The affected area is more likely to become injured or infected. Take these steps to help prevent infection: ? Keep the affected area clean and dry. ? Use approved creams or lotions to keep the skin moisturized. ? Protect your skin from  cuts:  Use gloves while cooking or gardening.  Do not walk barefoot.  If you shave the affected area, use an Copy.  Do not wear tight clothes, shoes, or jewelry.  Eat a healthy diet that includes a lot of fruits and vegetables. Activity  Do exercises as told by your health care provider.  Do not sit with your legs crossed.  When possible, keep the affected limb raised (elevated) above the level of your heart.  Avoid carrying things with an arm that is affected by lymphedema. General instructions  Wear compression stockings or sleeves as told by your health care provider.  Note any changes in size of the affected limb. You may be instructed to take regular measurements and keep track of them.  Take over-the-counter and prescription medicines only as told by your health care provider.  If you were prescribed an antibiotic medicine, take or apply it as told by your health care provider. Do not stop using the antibiotic even if you start to feel better or if your condition improves.  Do not use heating pads or ice packs on the affected area.  Avoid having blood draws, IV insertions, or blood pressure checks on the affected limb.  Keep all follow-up visits. This is important. Contact a health care provider if you:  Continue to have swelling in your limb.  Have fluid leaking from the skin of your swollen limb.  Have a cut that does not heal.  Have redness or pain in the affected area.  Develop purplish spots, rash, blisters, or sores (lesions) on your affected limb. Get help right away if you:  Have new swelling in your limb that starts suddenly.  Have shortness of breath or chest pain.  Have a fever or chills. These symptoms may represent a serious problem that is an emergency. Do not wait to see if the symptoms will go away. Get medical help right away. Call your local emergency services (911 in the [place] Do not drive yourself to the  hospital. Summary  Lymphedema is swelling that is caused by the abnormal collection of lymph in the tissues under the skin.  Lymph is fluid from the tissues in your body that is removed through the lymphatic system. This system collects and carries excess fluid, fats, proteins, and wastes from the tissues of the body to the bloodstream.  Lymphedema causes swelling, pain, and redness in the affected area. This may include the trunk area, or an arm or leg.  Treatment for this condition may depend on the cause of your lymphedema. Treatment may include treating the underlying cause, complete decongestive therapy (CDT), compression methods, or surgery. This information is not intended to replace advice given to you by your health care provider. Make sure you discuss any questions you have with your health care provider. Document Revised: 05/06/2020 Document Reviewed: 05/06/2020 Elsevier Patient Education  2021 [employer].

## 2020-08-04 NOTE — Progress Notes (Signed)
MRN : [id number]  Summer Bishop is a 70 y.o. (January 26, 1951) female who presents with chief complaint of  Chief Complaint  Patient presents with  . Follow-up    36yr follow up  .  History of Present Illness: Patient returns today in follow up of her lymphedema.  Overall, her leg swelling is down and her legs are doing okay.  She has been using her compression stockings daily.  She was using her lymphedema pump regularly until she fell and broke her wrist.  She fell because her left leg gave out.  She was concerned this could have been from her lymphedema. We discussed that this leg giving out was less likely from the lymphedema and more than likely from a musculoskeletal issue.   Current Outpatient Medications  Medication Sig Dispense Refill  . acetaminophen (TYLENOL) 500 MG tablet Take 500-1,000 mg by mouth every 6 (six) hours as needed for mild pain or headache.    . albuterol (PROVENTIL HFA;VENTOLIN HFA) 108 (90 Base) MCG/ACT inhaler Inhale 2 puffs into the lungs every 6 (six) hours as needed for wheezing or shortness of breath. 1 Inhaler 0  . alendronate (FOSAMAX) 70 MG tablet Take 1 tablet (70 mg total) by mouth every 7 (seven) days. Take with a full glass of water on an empty stomach. (Patient taking differently: Take 70 mg by mouth every Saturday. Take with a full glass of water on an empty stomach.) 4 tablet 11  . amLODipine (NORVASC) 10 MG tablet Take 10 mg by mouth daily with lunch.   1  . Calcium Carbonate-Vitamin D 600-400 MG-UNIT tablet Take 2 tablets by mouth daily with lunch.     . diphenhydrAMINE (BENADRYL) 25 MG tablet Take 25 mg by mouth as needed (itching and allergies).     Marland Kitchen HYDROcodone-acetaminophen (NORCO/VICODIN) 5-325 MG tablet Take 1 tablet by mouth every 4 (four) hours as needed for moderate pain. 30 tablet 0  . Multiple Vitamin (MULTI-VITAMINS) TABS Take 1 tablet by mouth daily with lunch.     . triamcinolone ointment (KENALOG) 0.1 % Apply 1 application topically  daily as needed (Eczema).    Marland Kitchen UNABLE TO FIND Take 1 tablet by mouth in the morning and at bedtime. Focus multivitamin eye    . hydrochlorothiazide (HYDRODIURIL) 25 MG tablet Take 25 mg by mouth daily with lunch.      No current facility-administered medications for this visit.    Past Medical History:  Diagnosis Date  . Asthma   . Chicken pox   . Headache   . Hypertension   . Lymphedema     Past Surgical History:  Procedure Laterality Date  . ABDOMINAL HYSTERECTOMY    . APPENDECTOMY    . COLONOSCOPY WITH PROPOFOL N/A 03/29/2017   Procedure: COLONOSCOPY WITH PROPOFOL;  Surgeon: Lin Landsman, MD;  Location: [facility] ENDOSCOPY;  Service: Gastroenterology;  Laterality: N/A;  . OPEN REDUCTION INTERNAL FIXATION (ORIF) DISTAL RADIAL FRACTURE Left 03/20/2020   Procedure: OPEN REDUCTION INTERNAL FIXATION (ORIF) DISTAL RADIAL FRACTURE;  Surgeon: Hessie Knows, MD;  Location: [facility] ORS;  Service: Orthopedics;  Laterality: Left;  . TONSILLECTOMY       Social History   Tobacco Use  . Smoking status: Never Smoker  . Smokeless tobacco: Never Used  Vaping Use  . Vaping Use: Never used  Substance Use Topics  . Alcohol use: No  . Drug use: No      Family History  Problem Relation Age of Onset  . Stroke  Mother   . Dementia Mother   . Alzheimer's disease Father   . Kidney cancer Father   . Liver cancer Father   . Stroke Father   . Heart Problems Father   . Breast cancer Sister        late 80's  . Breast cancer Maternal Aunt        3 aunts    Allergies  Allergen Reactions  . Nsaids Anaphylaxis  . Ace Inhibitors Swelling    Tongue swelling  . Aspirin Swelling  . Latex Swelling  . Lisinopril Other (See Comments)    unknown  . Naproxen Other (See Comments)    unknown  . Sulfa Antibiotics Other (See Comments)    Childhood    REVIEW OF SYSTEMS(Negative unless checked)  Constitutional: [] ???Weight loss [] ???Fever [] ???Chills Cardiac: [] ???Chest pain [] ???Chest  pressure [] ???Palpitations [] ???Shortness of breath when laying flat [] ???Shortness of breath at rest [] ???Shortness of breath with exertion. Vascular: [] ???Pain in legs with walking [] ???Pain in legs at rest [] ???Pain in legs when laying flat [] ???Claudication [] ???Pain in feet when walking [] ???Pain in feet at rest [] ???Pain in feet when laying flat [] ???History of DVT [] ???Phlebitis [x] ???Swelling in legs [x] ???Varicose veins [] ???Non-healing ulcers Pulmonary: [] ???Uses home oxygen [] ???Productive cough [] ???Hemoptysis [] ???Wheeze [] ???COPD [] ???Asthma Neurologic: [] ???Dizziness [] ???Blackouts [] ???Seizures [] ???History of stroke [] ???History of TIA [] ???Aphasia [] ???Temporary blindness [] ???Dysphagia [] ???Weakness or numbness in arms [] ???Weakness or numbness in legs Musculoskeletal: [] ???Arthritis [] ???Joint swelling [] ???Joint pain [] ???Low back pain Hematologic: [] ???Easy bruising [] ???Easy bleeding [] ???Hypercoagulable state [x] ???Anemic [] ???Hepatitis Gastrointestinal: [] ???Blood in stool [] ???Vomiting blood [x] ???Gastroesophageal reflux/heartburn [] ???Abdominal pain Genitourinary: [] ???Chronic kidney disease [] ???Difficult urination [] ???Frequent urination [] ???Burning with urination [] ???Hematuria Skin: [] ???Rashes [] ???Ulcers [] ???Wounds Psychological: [x] ???History of anxiety [] ???History of major depression.  Physical Examination  BP (!) 154/88 (BP Location: Right Arm)   Pulse 85   Resp 16   Wt 167 lb (75.8 kg)   BMI 30.54 kg/m  Gen:  WD/WN, NAD Head: [place]/AT, No temporalis wasting. Ear/Nose/Throat: Hearing grossly intact, nares w/o erythema or drainage Eyes: Conjunctiva clear. Sclera non-icteric Neck: Supple.  Trachea midline Pulmonary:  Good air movement, no use of accessory muscles.  Cardiac: RRR, no JVD Vascular:  Vessel Right Left  Radial Palpable Palpable                           PT Palpable Palpable  DP Palpable Palpable    Musculoskeletal: M/S 5/5 throughout.  No deformity or atrophy. Trace LE edema. Neurologic: Sensation grossly intact in extremities.  Symmetrical.  Speech is fluent.  Psychiatric: Judgment intact, Mood & affect appropriate for pt's clinical situation. Dermatologic: No rashes or ulcers noted.  No cellulitis or open wounds.       Labs No results found for this or any previous visit (from the past 2160 hour(s)).  Radiology No results found.  Assessment/Plan  Benign essential HTN blood pressure control important in reducing the progression of atherosclerotic disease. On appropriate oral medications.   Lymphedema Her lymphedema is fairly well controlled currently.  Continue her current conservative therapies of compression socks and elevation as well as lymphedema pump as needed.  We discussed that lymphedema is a lifelong problem and when she have it you really never get rid of it.  We are only managing it to keep it under control.  I will see her back in 1 year.    Leotis Pain, MD  08/04/2020 2:28 PM    This note was created with Dragon medical transcription system.  Any errors from dictation are purely unintentional

## 2020-08-04 NOTE — Assessment & Plan Note (Signed)
blood pressure control important in reducing the progression of atherosclerotic disease. On appropriate oral medications.  

## 2020-08-04 NOTE — Assessment & Plan Note (Signed)
Her lymphedema is fairly well controlled currently.  Continue her current conservative therapies of compression socks and elevation as well as lymphedema pump as needed.  We discussed that lymphedema is a lifelong problem and when she have it you really never get rid of it.  We are only managing it to keep it under control.  I will see her back in 1 year.

## 2020-08-25 DIAGNOSIS — Z961 Presence of intraocular lens: Secondary | ICD-10-CM | POA: Diagnosis not present

## 2020-08-25 DIAGNOSIS — H10523 Angular blepharoconjunctivitis, bilateral: Secondary | ICD-10-CM | POA: Diagnosis not present

## 2020-09-29 DIAGNOSIS — E7800 Pure hypercholesterolemia, unspecified: Secondary | ICD-10-CM | POA: Diagnosis not present

## 2020-09-29 DIAGNOSIS — I10 Essential (primary) hypertension: Secondary | ICD-10-CM | POA: Diagnosis not present

## 2020-10-06 DIAGNOSIS — I10 Essential (primary) hypertension: Secondary | ICD-10-CM | POA: Diagnosis not present

## 2020-10-06 DIAGNOSIS — R635 Abnormal weight gain: Secondary | ICD-10-CM | POA: Diagnosis not present

## 2020-10-06 DIAGNOSIS — E7800 Pure hypercholesterolemia, unspecified: Secondary | ICD-10-CM | POA: Diagnosis not present

## 2020-10-06 DIAGNOSIS — K921 Melena: Secondary | ICD-10-CM | POA: Diagnosis not present

## 2020-10-06 DIAGNOSIS — Z9989 Dependence on other enabling machines and devices: Secondary | ICD-10-CM | POA: Diagnosis not present

## 2020-10-06 DIAGNOSIS — G43809 Other migraine, not intractable, without status migrainosus: Secondary | ICD-10-CM | POA: Diagnosis not present

## 2020-10-06 DIAGNOSIS — Z0000 Encounter for general adult medical examination without abnormal findings: Secondary | ICD-10-CM | POA: Diagnosis not present

## 2020-12-03 DIAGNOSIS — Z01818 Encounter for other preprocedural examination: Secondary | ICD-10-CM | POA: Diagnosis not present

## 2020-12-03 DIAGNOSIS — K219 Gastro-esophageal reflux disease without esophagitis: Secondary | ICD-10-CM | POA: Insufficient documentation

## 2020-12-03 DIAGNOSIS — D126 Benign neoplasm of colon, unspecified: Secondary | ICD-10-CM | POA: Insufficient documentation

## 2020-12-03 DIAGNOSIS — R1313 Dysphagia, pharyngeal phase: Secondary | ICD-10-CM | POA: Diagnosis not present

## 2020-12-03 DIAGNOSIS — K582 Mixed irritable bowel syndrome: Secondary | ICD-10-CM | POA: Diagnosis not present

## 2020-12-03 DIAGNOSIS — K921 Melena: Secondary | ICD-10-CM | POA: Insufficient documentation

## 2020-12-08 DIAGNOSIS — K921 Melena: Secondary | ICD-10-CM | POA: Diagnosis not present

## 2020-12-08 DIAGNOSIS — R1313 Dysphagia, pharyngeal phase: Secondary | ICD-10-CM | POA: Diagnosis not present

## 2020-12-08 DIAGNOSIS — R1310 Dysphagia, unspecified: Secondary | ICD-10-CM | POA: Diagnosis not present

## 2020-12-08 DIAGNOSIS — K2970 Gastritis, unspecified, without bleeding: Secondary | ICD-10-CM | POA: Diagnosis not present

## 2021-02-25 DIAGNOSIS — D126 Benign neoplasm of colon, unspecified: Secondary | ICD-10-CM | POA: Diagnosis not present

## 2021-02-25 DIAGNOSIS — R1313 Dysphagia, pharyngeal phase: Secondary | ICD-10-CM | POA: Diagnosis not present

## 2021-02-25 DIAGNOSIS — K219 Gastro-esophageal reflux disease without esophagitis: Secondary | ICD-10-CM | POA: Diagnosis not present

## 2021-04-08 ENCOUNTER — Other Ambulatory Visit: Payer: Self-pay | Admitting: Internal Medicine

## 2021-04-08 DIAGNOSIS — I10 Essential (primary) hypertension: Secondary | ICD-10-CM | POA: Diagnosis not present

## 2021-04-08 DIAGNOSIS — K219 Gastro-esophageal reflux disease without esophagitis: Secondary | ICD-10-CM | POA: Diagnosis not present

## 2021-04-08 DIAGNOSIS — G43809 Other migraine, not intractable, without status migrainosus: Secondary | ICD-10-CM | POA: Diagnosis not present

## 2021-04-08 DIAGNOSIS — J45990 Exercise induced bronchospasm: Secondary | ICD-10-CM | POA: Diagnosis not present

## 2021-04-08 DIAGNOSIS — Z1231 Encounter for screening mammogram for malignant neoplasm of breast: Secondary | ICD-10-CM | POA: Diagnosis not present

## 2021-04-08 DIAGNOSIS — Z23 Encounter for immunization: Secondary | ICD-10-CM | POA: Diagnosis not present

## 2021-04-08 DIAGNOSIS — E7800 Pure hypercholesterolemia, unspecified: Secondary | ICD-10-CM | POA: Diagnosis not present

## 2021-04-20 ENCOUNTER — Ambulatory Visit
Admission: RE | Admit: 2021-04-20 | Discharge: 2021-04-20 | Disposition: A | Discharge status: Home / Self Care | Payer: PPO | Source: Ambulatory Visit | Attending: Internal Medicine | Admitting: Internal Medicine

## 2021-04-20 ENCOUNTER — Other Ambulatory Visit: Payer: Self-pay

## 2021-04-20 DIAGNOSIS — Z1231 Encounter for screening mammogram for malignant neoplasm of breast: Secondary | ICD-10-CM | POA: Insufficient documentation

## 2021-04-24 ENCOUNTER — Emergency Department
Admission: EM | Admit: 2021-04-24 | Discharge: 2021-04-24 | Disposition: A | Discharge status: Home / Self Care | Payer: PPO | Attending: Emergency Medicine | Admitting: Emergency Medicine

## 2021-04-24 ENCOUNTER — Other Ambulatory Visit: Payer: Self-pay

## 2021-04-24 DIAGNOSIS — Z91040 Latex allergy status: Secondary | ICD-10-CM | POA: Insufficient documentation

## 2021-04-24 DIAGNOSIS — Z79899 Other long term (current) drug therapy: Secondary | ICD-10-CM | POA: Insufficient documentation

## 2021-04-24 DIAGNOSIS — J45909 Unspecified asthma, uncomplicated: Secondary | ICD-10-CM | POA: Diagnosis not present

## 2021-04-24 DIAGNOSIS — G43109 Migraine with aura, not intractable, without status migrainosus: Secondary | ICD-10-CM | POA: Diagnosis not present

## 2021-04-24 DIAGNOSIS — R519 Headache, unspecified: Secondary | ICD-10-CM | POA: Diagnosis present

## 2021-04-24 DIAGNOSIS — I10 Essential (primary) hypertension: Secondary | ICD-10-CM | POA: Diagnosis not present

## 2021-04-24 NOTE — ED Notes (Signed)
Patient not present in room for discharge or to provide discharge paperwork.

## 2021-04-24 NOTE — Discharge Instructions (Addendum)
Follow-up with your provider for continued symptoms.

## 2021-04-24 NOTE — ED Triage Notes (Signed)
Pt comes pov with flashes of color change in her right eye this morning. Has since resolved. Denies any pain associated in the moment but states a slight residual headache after.

## 2021-04-24 NOTE — ED Notes (Signed)
RN to bedside. Pt stated this morning she was seeing "bright flashing lights" in her field of vision. This lasted about 5-10 minutes. She then lost her vision completely. Pt vision has now completely returned. Pt has had cataracts surgery in both eyes prior but not recently.

## 2021-04-25 NOTE — ED Provider Notes (Signed)
[facility] Emergency Department Provider Note ____________________________________________  Time seen: 1206  I have reviewed the triage vital signs and the nursing notes.  HISTORY  Chief Complaint  Eye Problem   HPI Summer Bishop is a 70 y.o. female with a history below, presents to the ED with now resolved symptoms of flashes of color change to her right eye.  Patient reports onset this morning.  Lasted approximately 5 minutes.  Subsequent to that, she noted a peripheral field deficit on her right eye alone.  She denies any associated head injury, eye trauma, or eye pain.  She does note a mild residual headache to the right side of the forehead at this time patient denies any associated nausea, vomiting, vertigo, dizziness, or subsequent vision loss.  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   Annual physical exam 02/12/2016   Eczema 12/03/2013   Benign essential HTN 12/03/2013   Anxiety 12/03/2013   Migraine 12/03/2013   Other closed fractures of distal end of radius (alone) 12/03/2013   Hereditary and idiopathic peripheral neuropathy 12/03/2013    Past Surgical History:  Procedure Laterality Date   ABDOMINAL HYSTERECTOMY     APPENDECTOMY     COLONOSCOPY WITH PROPOFOL N/A 03/29/2017   Procedure: COLONOSCOPY WITH PROPOFOL;  Surgeon: Lin Landsman, MD;  Location: [facility] ENDOSCOPY;  Service: Gastroenterology;  Laterality: N/A;   OPEN  REDUCTION INTERNAL FIXATION (ORIF) DISTAL RADIAL FRACTURE Left 03/20/2020   Procedure: OPEN REDUCTION INTERNAL FIXATION (ORIF) DISTAL RADIAL FRACTURE;  Surgeon: Hessie Knows, MD;  Location: [facility] ORS;  Service: Orthopedics;  Laterality: Left;   TONSILLECTOMY      Prior to Admission medications   Medication Sig Start Date End Date Taking? Authorizing Provider  acetaminophen (TYLENOL) 500 MG tablet Take 500-1,000 mg by mouth every 6 (six) hours as needed for mild pain or headache.    [provider]  albuterol (PROVENTIL HFA;VENTOLIN HFA) 108 (90 Base) MCG/ACT inhaler Inhale 2 puffs into the lungs every 6 (six) hours as needed for wheezing or shortness of breath. 02/14/17   Coral Spikes, DO  alendronate (FOSAMAX) 70 MG tablet Take 1 tablet (70 mg total) by mouth every 7 (seven) days. Take with a full glass of water on an empty stomach. Patient taking differently: Take 70 mg by mouth every Saturday. Take with a full glass of water on an empty stomach. 04/25/17   Coral Spikes, DO  amLODipine (NORVASC) 10 MG tablet Take 10 mg by mouth daily with lunch.  02/05/18   [provider]  Calcium Carbonate-Vitamin D 600-400 MG-UNIT tablet Take 2 tablets by mouth daily with lunch.     [provider]  diphenhydrAMINE (BENADRYL) 25 MG tablet Take 25 mg by mouth as needed (itching and allergies).     [provider]  hydrochlorothiazide (HYDRODIURIL) 25 MG tablet Take 25 mg by mouth daily with lunch.  02/05/18 03/18/20  [provider]  HYDROcodone-acetaminophen (NORCO/VICODIN) 5-325 MG tablet Take 1 tablet by mouth every 4 (four) hours as needed for moderate pain. 03/20/20   Hessie Knows, MD  Multiple Vitamin (MULTI-VITAMINS) TABS Take 1 tablet by mouth daily with lunch.     [provider]  triamcinolone ointment (KENALOG) 0.1 % Apply 1 application topically daily as needed (Eczema).    [provider]  UNABLE TO FIND Take 1 tablet by mouth in the  morning and at bedtime. Focus multivitamin eye    [provider]    Allergies Nsaids, Ace inhibitors, Aspirin, Latex, Lisinopril, Naproxen, and Sulfa antibiotics  Family History  Problem Relation Age of Onset   Stroke Mother    Dementia Mother    Alzheimer's disease Father    Kidney cancer Father    Liver cancer Father    Stroke Father    Heart Problems Father    Breast cancer Sister        late 62's   Breast cancer Maternal Aunt        3 aunts    Social History Social History   Tobacco Use   Smoking status: Never   Smokeless tobacco: Never  Vaping Use   Vaping Use: Never used  Substance Use Topics   Alcohol use: No   Drug use: No    Review of Systems  Constitutional: Negative for fever. Eyes: Positive for visual changes. ENT: Negative for sore throat. Cardiovascular: Negative for chest pain. Respiratory: Negative for shortness of breath. Gastrointestinal: Negative for abdominal pain, vomiting and diarrhea. Genitourinary: Negative for dysuria. Musculoskeletal: Negative for back pain. Skin: Negative for rash. Neurological: Negative for headaches, focal weakness or numbness. ____________________________________________  PHYSICAL EXAM:  VITAL SIGNS: ED Triage Vitals  Enc Vitals Group     BP 04/24/21 1118 (!) 160/88     Pulse Rate 04/24/21 1118 69     Resp 04/24/21 1118 18     Temp 04/24/21 1118 98 F (36.7 C)     Temp Source 04/24/21 1118 Oral     SpO2 04/24/21 1118 98 %     Weight 04/24/21 1115 171 lb (77.6 kg)     Height 04/24/21 1115 5\' 2"  (1.575 m)     Head Circumference --      Peak Flow --      Pain Score 04/24/21 1115 0     Pain Loc --      Pain Edu? --      Excl. in [place]? --     Constitutional: Alert and oriented. Well appearing and in no distress. Head: Normocephalic and atraumatic. Eyes: Conjunctivae are normal. PERRL. Normal extraocular movements and fundi bilaterally.  No evidence of papilledema Ears: Canals clear. TMs intact  bilaterally. Nose: No congestion/rhinorrhea/epistaxis. Mouth/Throat: Mucous membranes are moist. Neck: Supple. No thyromegaly. Hematological/Lymphatic/Immunological: No cervical lymphadenopathy. Cardiovascular: Normal rate, regular rhythm. Normal distal pulses. Respiratory: Normal respiratory effort. No wheezes/rales/rhonchi. Gastrointestinal: Soft and nontender. No distention. Musculoskeletal: Nontender with normal range of motion in all extremities.  Neurologic:  Normal gait without ataxia. Normal speech and language. No gross focal neurologic deficits are appreciated. Skin:  Skin is warm, dry and intact. No rash noted. Psychiatric: Mood and affect are normal. Patient exhibits appropriate insight and judgment. ____________________________________________    {LABS (pertinent positives/negatives)  ____________________________________________  {EKG  ____________________________________________   RADIOLOGY Official radiology report(s): No results found. ____________________________________________  PROCEDURES   Procedures ____________________________________________   INITIAL IMPRESSION / ASSESSMENT AND PLAN / ED COURSE  As part  of my medical decision making, I reviewed the following data within the electronic MEDICAL RECORD NUMBER Notes from prior ED visits   ----------------------------------------- 2:25 PM on 04/24/2021 ----------------------------------------- S/W Dr. Michelene Heady: He confirmed based on the clinical presentation and HPI, that the patient's symptoms represented a migraine aura, and there is no indication or need for any further work-up.  Patient should follow with primary provider for ongoing management.   Summer Bishop was evaluated in Emergency Department on 04/25/2021 for the symptoms described in the history of present illness. She was evaluated in the context of the global COVID-19 pandemic, which necessitated consideration that the patient might be at risk  for infection with the SARS-CoV-2 virus that causes COVID-19. Institutional protocols and algorithms that pertain to the evaluation of patients at risk for COVID-19 are in a state of rapid change based on information released by regulatory bodies including the CDC and federal and state organizations. These policies and algorithms were followed during the patient's care in the ED. ____________________________________________  FINAL CLINICAL IMPRESSION(S) / ED DIAGNOSES  Final diagnoses:  Migraine with aura and without status migrainosus, not intractable      Carmie End, Dannielle Karvonen, PA-C 04/25/21 1627    Arta Silence, MD 04/25/21 1931

## 2021-05-20 DIAGNOSIS — R6889 Other general symptoms and signs: Secondary | ICD-10-CM | POA: Diagnosis not present

## 2021-08-03 ENCOUNTER — Other Ambulatory Visit: Payer: Self-pay

## 2021-08-03 ENCOUNTER — Ambulatory Visit (INDEPENDENT_AMBULATORY_CARE_PROVIDER_SITE_OTHER): Payer: PPO | Admitting: Vascular Surgery

## 2021-08-03 ENCOUNTER — Encounter (INDEPENDENT_AMBULATORY_CARE_PROVIDER_SITE_OTHER): Payer: Self-pay | Admitting: Vascular Surgery

## 2021-08-03 VITALS — BP 146/84 | HR 85 | Resp 16 | Ht 62.0 in | Wt 170.0 lb

## 2021-08-03 DIAGNOSIS — I890 Lymphedema, not elsewhere classified: Secondary | ICD-10-CM

## 2021-08-03 DIAGNOSIS — I10 Essential (primary) hypertension: Secondary | ICD-10-CM

## 2021-08-03 NOTE — Progress Notes (Signed)
MRN : [id number]  Summer Bishop is a 71 y.o. (21-Jul-1951) female who presents with chief complaint of  Chief Complaint  Patient presents with   Follow-up    1 yr no studies  .  History of Present Illness: Patient returns today in follow up of her lymphedema.  This is under good control with compression socks, elevation, and her lymphedema pump.  She has a sleeve for her arm as well.  She says she really only notices swelling when she gets sick or has some other issue that makes her immobile.  She is doing well and has no other complaints today.  She does have questions about flying and we answered those for her today.  Current Outpatient Medications  Medication Sig Dispense Refill   acetaminophen (TYLENOL) 500 MG tablet Take 500-1,000 mg by mouth every 6 (six) hours as needed for mild pain or headache.     albuterol (PROVENTIL HFA;VENTOLIN HFA) 108 (90 Base) MCG/ACT inhaler Inhale 2 puffs into the lungs every 6 (six) hours as needed for wheezing or shortness of breath. 1 Inhaler 0   alendronate (FOSAMAX) 70 MG tablet Take 1 tablet (70 mg total) by mouth every 7 (seven) days. Take with a full glass of water on an empty stomach. (Patient taking differently: Take 70 mg by mouth every Saturday. Take with a full glass of water on an empty stomach.) 4 tablet 11   amLODipine (NORVASC) 10 MG tablet Take 10 mg by mouth daily with lunch.   1   Calcium Carbonate-Vitamin D 600-400 MG-UNIT tablet Take 2 tablets by mouth daily with lunch.      diphenhydrAMINE (BENADRYL) 25 MG tablet Take 25 mg by mouth as needed (itching and allergies).      hydrochlorothiazide (HYDRODIURIL) 25 MG tablet Take 25 mg by mouth daily with lunch.      HYDROcodone-acetaminophen (NORCO/VICODIN) 5-325 MG tablet Take 1 tablet by mouth every 4 (four) hours as needed for moderate pain. 30 tablet 0   Multiple Vitamin (MULTI-VITAMINS) TABS Take 1 tablet by mouth daily with lunch.      triamcinolone ointment (KENALOG) 0.1 % Apply 1  application topically daily as needed (Eczema).     UNABLE TO FIND Take 1 tablet by mouth in the morning and at bedtime. Focus multivitamin eye     No current facility-administered medications for this visit.    Past Medical History:  Diagnosis Date   Asthma    Chicken pox    Headache    Hypertension    Lymphedema     Past Surgical History:  Procedure Laterality Date   ABDOMINAL HYSTERECTOMY     APPENDECTOMY     COLONOSCOPY WITH PROPOFOL N/A 03/29/2017   Procedure: COLONOSCOPY WITH PROPOFOL;  Surgeon: Lin Landsman, MD;  Location: [facility] ENDOSCOPY;  Service: Gastroenterology;  Laterality: N/A;   OPEN REDUCTION INTERNAL FIXATION (ORIF) DISTAL RADIAL FRACTURE Left 03/20/2020   Procedure: OPEN REDUCTION INTERNAL FIXATION (ORIF) DISTAL RADIAL FRACTURE;  Surgeon: Hessie Knows, MD;  Location: [facility] ORS;  Service: Orthopedics;  Laterality: Left;   TONSILLECTOMY       Social History   Tobacco Use   Smoking status: Never   Smokeless tobacco: Never  Vaping Use   Vaping Use: Never used  Substance Use Topics   Alcohol use: No   Drug use: No      Family History  Problem Relation Age of Onset   Stroke Mother    Dementia Mother    Alzheimer's  disease Father    Kidney cancer Father    Liver cancer Father    Stroke Father    Heart Problems Father    Breast cancer Sister        late 8's   Breast cancer Maternal Aunt        3 aunts     Allergies  Allergen Reactions   Nsaids Anaphylaxis   Ace Inhibitors Swelling    Tongue swelling   Aspirin Swelling   Latex Swelling   Lisinopril Other (See Comments)    unknown   Naproxen Other (See Comments)    unknown   Sulfa Antibiotics Other (See Comments)    Childhood    REVIEW OF SYSTEMS (Negative unless checked)   Constitutional: [] Weight loss  [] Fever  [] Chills Cardiac: [] Chest pain   [] Chest pressure   [] Palpitations   [] Shortness of breath when laying flat   [] Shortness of breath at rest   [] Shortness of breath with  exertion. Vascular:  [] Pain in legs with walking   [] Pain in legs at rest   [] Pain in legs when laying flat   [] Claudication   [] Pain in feet when walking  [] Pain in feet at rest  [] Pain in feet when laying flat   [] History of DVT   [] Phlebitis   [x] Swelling in legs   [x] Varicose veins   [] Non-healing ulcers Pulmonary:   [] Uses home oxygen   [] Productive cough   [] Hemoptysis   [] Wheeze  [] COPD   [] Asthma Neurologic:  [] Dizziness  [] Blackouts   [] Seizures   [] History of stroke   [] History of TIA  [] Aphasia   [] Temporary blindness   [] Dysphagia   [] Weakness or numbness in arms   [] Weakness or numbness in legs Musculoskeletal:  [] Arthritis   [] Joint swelling   [] Joint pain   [] Low back pain Hematologic:  [] Easy bruising  [] Easy bleeding   [] Hypercoagulable state   [x] Anemic  [] Hepatitis Gastrointestinal:  [] Blood in stool   [] Vomiting blood  [x] Gastroesophageal reflux/heartburn   [] Abdominal pain Genitourinary:  [] Chronic kidney disease   [] Difficult urination  [] Frequent urination  [] Burning with urination   [] Hematuria Skin:  [] Rashes   [] Ulcers   [] Wounds Psychological:  [x] History of anxiety   []  History of major depression.   Physical Examination  There were no vitals taken for this visit. Gen:  WD/WN, NAD Head: /AT, No temporalis wasting. Ear/Nose/Throat: Hearing grossly intact, nares w/o erythema or drainage Eyes: Conjunctiva clear. Sclera non-icteric Neck: Supple.  Trachea midline Pulmonary:  Good air movement, no use of accessory muscles.  Cardiac: RRR, no JVD Vascular:  Vessel Right Left  Radial Palpable Palpable                          PT Palpable Palpable  DP Palpable Palpable   Gastrointestinal: soft, non-tender/non-distended. No guarding/reflex.  Musculoskeletal: M/S 5/5 throughout.  No deformity or atrophy.  No significant lower extremity edema.  Trace left upper extremity edema Neurologic: Sensation grossly intact in extremities.  Symmetrical.  Speech is fluent.   Psychiatric: Judgment intact, Mood & affect appropriate for pt's clinical situation. Dermatologic: No rashes or ulcers noted.  No cellulitis or open wounds.      Labs No results found for this or any previous visit (from the past 2160 hour(s)).  Radiology No results found.  Assessment/Plan Benign essential HTN blood pressure control important in reducing the progression of atherosclerotic disease. On appropriate oral medications.  Lymphedema Her lymphedema is fairly well controlled currently.  Continue her current conservative therapies of compression socks and elevation as well as lymphedema pump as needed. Her arm sleeve is working well too. We discussed that lymphedema is a lifelong problem and when she have it you really never get rid of it.  We are only managing it to keep it under control.  I will see her back in 1 year.   Leotis Pain, MD  08/03/2021 1:18 PM    This note was created with Dragon medical transcription system.  Any errors from dictation are purely unintentional

## 2021-09-16 DIAGNOSIS — B359 Dermatophytosis, unspecified: Secondary | ICD-10-CM | POA: Diagnosis not present

## 2021-10-07 DIAGNOSIS — I10 Essential (primary) hypertension: Secondary | ICD-10-CM | POA: Diagnosis not present

## 2021-10-07 DIAGNOSIS — E7800 Pure hypercholesterolemia, unspecified: Secondary | ICD-10-CM | POA: Diagnosis not present

## 2021-10-07 DIAGNOSIS — R7309 Other abnormal glucose: Secondary | ICD-10-CM | POA: Diagnosis not present

## 2021-10-14 DIAGNOSIS — K649 Unspecified hemorrhoids: Secondary | ICD-10-CM | POA: Diagnosis not present

## 2021-10-14 DIAGNOSIS — E7800 Pure hypercholesterolemia, unspecified: Secondary | ICD-10-CM | POA: Diagnosis not present

## 2021-10-14 DIAGNOSIS — R7303 Prediabetes: Secondary | ICD-10-CM | POA: Diagnosis not present

## 2021-10-14 DIAGNOSIS — Z0000 Encounter for general adult medical examination without abnormal findings: Secondary | ICD-10-CM | POA: Diagnosis not present

## 2021-10-14 DIAGNOSIS — J45990 Exercise induced bronchospasm: Secondary | ICD-10-CM | POA: Diagnosis not present

## 2021-10-14 DIAGNOSIS — I10 Essential (primary) hypertension: Secondary | ICD-10-CM | POA: Diagnosis not present

## 2021-10-19 ENCOUNTER — Ambulatory Visit: Payer: PPO | Admitting: Dermatology

## 2021-10-19 ENCOUNTER — Other Ambulatory Visit: Payer: Self-pay

## 2021-10-19 DIAGNOSIS — D225 Melanocytic nevi of trunk: Secondary | ICD-10-CM

## 2021-10-19 DIAGNOSIS — B079 Viral wart, unspecified: Secondary | ICD-10-CM

## 2021-10-19 DIAGNOSIS — L309 Dermatitis, unspecified: Secondary | ICD-10-CM | POA: Diagnosis not present

## 2021-10-19 DIAGNOSIS — M720 Palmar fascial fibromatosis [Dupuytren]: Secondary | ICD-10-CM | POA: Diagnosis not present

## 2021-10-19 DIAGNOSIS — L821 Other seborrheic keratosis: Secondary | ICD-10-CM | POA: Diagnosis not present

## 2021-10-19 MED ORDER — HYDROCORTISONE 2.5 % EX CREA
TOPICAL_CREAM | CUTANEOUS | 2 refills | Status: AC
Start: 2021-10-19 — End: ?

## 2021-10-19 NOTE — Progress Notes (Signed)
? ?New Patient Visit ? ?Subjective  ?Summer Bishop is a 71 y.o. female who presents for the following: New Patient (Initial Visit) (Patient here today concerning some moles at back she would like checked, eczema at body and face, possible wart at right palm hand, and raised areas at left palm. She is not currently using any treatment for eczema. ). ? ?The patient has spots, moles and lesions to be evaluated, some may be new or changing and the patient has concerns that these could be cancer. ? ? ?The following portions of the chart were reviewed this encounter and updated as appropriate:  ?  ?  ? ?Review of Systems:  No other skin or systemic complaints except as noted in HPI or Assessment and Plan. ? ?Objective  ?Well appearing patient in no apparent distress; mood and affect are within normal limits. ? ?A focused examination was performed including bilateral hands, face, scalp, back, arms . Relevant physical exam findings are noted in the Assessment and Plan. ? ?face, scalp, body ?Perioccular erythema  ? ?palm of right hand x 1 ?Verrucous papules -- Discussed viral etiology and contagion.  ? ?left hand ?Linear firm nodules palm.  Moves with tendon, but no pain or hand/finger contraction ? ? ? ?Assessment & Plan  ?Eczema, unspecified type ?face, scalp, body ? ?Seb dermatitis vrs atopic dermatitis ? ?Atopic dermatitis (eczema) is a chronic, relapsing, pruritic condition that can significantly affect quality of life. It is often associated with allergic rhinitis and/or asthma and can require treatment with topical medications, phototherapy, or in severe cases biologic injectable medication (Dupixent; Adbry) or Oral JAK inhibitors. ? ?Patient is bothered by dry areas around eyes and itchy areas at scalp. ? ?Start Hydrocortisone 2.5 % cream - apply to itchy areas at face and body 1 - 2 times daily. Use for only up to 2 weeks at eye area.  ? ?For itchy scalp recommend medicated Dove shampoo for Itchy Scalp - lather ,  left sit for a few minutes and then rinse. ? ?Topical steroids (such as triamcinolone, fluocinolone, fluocinonide, mometasone, clobetasol, halobetasol, betamethasone, hydrocortisone) can cause thinning and lightening of the skin if they are used for too long in the same area. Your physician has selected the right strength medicine for your problem and area affected on the body. Please use your medication only as directed by your physician to prevent side effects.  ? ?hydrocortisone 2.5 % cream - face, scalp, body ?Apply topically See admin instructions. Apply to itchy areas at face and body 1 - 2 times daily. Only use for up to 2 weeks around eye area. ? ?Viral warts, unspecified type ?palm of right hand x 1 ? ?Discussed viral etiology and risk of spread.  Discussed multiple treatments may be required to clear warts.  Discussed possible post-treatment dyspigmentation and risk of recurrence. ? ? ? ?Destruction of lesion - palm of right hand x 1 ? ?Destruction method: cryotherapy   ?Informed consent: discussed and consent obtained   ?Lesion destroyed using liquid nitrogen: Yes   ?Region frozen until ice ball extended beyond lesion: Yes   ?Outcome: patient tolerated procedure well with no complications   ?Post-procedure details: wound care instructions given   ?Additional details:  Prior to procedure, discussed risks of blister formation, small wound, skin dyspigmentation, or rare scar following cryotherapy. Recommend Vaseline ointment to treated areas while healing. ? ? 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 ?Benign. Observe.  ? ?If bothersome recommend seeing hand surgeon for further evaluation and  treatment  ? ? ?Seborrheic Keratoses ?- Stuck-on, waxy, tan-brown papules and/or plaques  ?- Benign-appearing ?- Discussed benign etiology and prognosis. ?- Observe ?- Call for any changes ? ?Melanocytic Nevi ?- Tan-brown and/or pink-flesh-colored symmetric macules and papules left upper back 7 mm pink flesh papule  ?-  Benign appearing on exam today ?- Observation ?- Call clinic for new or changing moles ?- Recommend daily use of broad spectrum spf 30+ sunscreen to sun-exposed areas.  ? ?Return in 1 month (on 11/19/2021) for wart, dermatitis. ? ?I, Ruthell Rummage, CMA, am acting as scribe for Brendolyn Patty, MD. ? ?Documentation: I have reviewed the above documentation for accuracy and completeness, and I agree with the above. ? ?Brendolyn Patty MD  ? ?

## 2021-10-19 NOTE — Patient Instructions (Addendum)
Recommend medicated Dove shampoo for itchy scalp  ? ? ? ?Topical steroids (such as triamcinolone, fluocinolone, fluocinonide, mometasone, clobetasol, halobetasol, betamethasone, hydrocortisone) can cause thinning and lightening of the skin if they are used for too long in the same area. Your physician has selected the right strength medicine for your problem and area affected on the body. Please use your medication only as directed by your physician to prevent side effects.  ? ? ? ?Seborrheic Keratosis ? ?What causes seborrheic keratoses? ?Seborrheic keratoses are harmless, common skin growths that first appear during adult life.  As time goes by, more growths appear.  Some people may develop a large number of them.  Seborrheic keratoses appear on both covered and uncovered body parts.  They are not caused by sunlight.  The tendency to develop seborrheic keratoses can be inherited.  They vary in color from skin-colored to gray, brown, or even black.  They can be either smooth or have a rough, warty surface.   ?Seborrheic keratoses are superficial and look as if they were stuck on the skin.  Under the microscope this type of keratosis looks like layers upon layers of skin.  That is why at times the top layer may seem to fall off, but the rest of the growth remains and re-grows.   ? ?Treatment ?Seborrheic keratoses do not need to be treated, but can easily be removed in the office.  Seborrheic keratoses often cause symptoms when they rub on clothing or jewelry.  Lesions can be in the way of shaving.  If they become inflamed, they can cause itching, soreness, or burning.  Removal of a seborrheic keratosis can be accomplished by freezing, burning, or surgery. ?If any spot bleeds, scabs, or grows rapidly, please return to have it checked, as these can be an indication of a skin cancer. ? ? ? ? ? ? ? ? ? ?If You Need Anything After Your Visit ? ?If you have any questions or concerns for your doctor, please call our main  line at [phone] and press option 4 to reach your doctor's medical assistant. If no one answers, please leave a voicemail as directed and we will return your call as soon as possible. Messages left after 4 pm will be answered the following business day.  ? ?You may also send [place] a message via MyChart. We typically respond to MyChart messages within 1-2 business days. ? ?For prescription refills, please ask your pharmacy to contact our office. Our fax number is [phone]. ? ?If you have an urgent issue when the clinic is closed that cannot wait until the next business day, you can page your doctor at the number below.   ? ?Please note that while we do our best to be available for urgent issues outside of office hours, we are not available 24/7.  ? ?If you have an urgent issue and are unable to reach [place], you may choose to seek medical care at your doctor's office, retail clinic, urgent care center, or emergency room. ? ?If you have a medical emergency, please immediately call 911 or go to the emergency department. ? ?Pager Numbers ? ?- Dr. Nehemiah Massed: [phone] ? ?- Dr. Laurence Ferrari: [phone] ? ?- Dr. Nicole Kindred: [phone] ? ?In the event of inclement weather, please call our main line at [phone] for an update on the status of any delays or closures. ? ?Dermatology Medication Tips: ?Please keep the boxes that topical medications come in in order to help keep track of the instructions about  where and how to use these. Pharmacies typically print the medication instructions only on the boxes and not directly on the medication tubes.  ? ?If your medication is too expensive, please contact our office at [phone] option 4 or send [place] a message through [place].  ? ?We are unable to tell what your co-pay for medications will be in advance as this is different depending on your insurance coverage. However, we may be able to find a substitute medication at lower cost or fill out paperwork to get insurance to cover a  needed medication.  ? ?If a prior authorization is required to get your medication covered by your insurance company, please allow [place] 1-2 business days to complete this process. ? ?Drug prices often vary depending on where the prescription is filled and some pharmacies may offer cheaper prices. ? ?The website www.goodrx.com contains coupons for medications through different pharmacies. The prices here do not account for what the cost may be with help from insurance (it may be cheaper with your insurance), but the website can give you the price if you did not use any insurance.  ?- You can print the associated coupon and take it with your prescription to the pharmacy.  ?- You may also stop by our office during regular business hours and pick up a GoodRx coupon card.  ?- If you need your prescription sent electronically to a different pharmacy, notify our office through [facility] or by phone at [phone] option 4. ? ? ? ? ?Si Usted Necesita Algo Despu?s de Su Visita ? ?Tambi?n puede enviarnos un mensaje a trav?s de MyChart. Por lo general respondemos a los mensajes de MyChart en el transcurso de 1 a 2 d?as h?biles. ? ?Para renovar recetas, por favor pida a su farmacia que se ponga en contacto con nuestra oficina. Nuestro n?mero de fax es el [phone]. ? ?Si tiene un asunto urgente cuando la cl?nica est? cerrada y que no puede esperar hasta el siguiente d?a h?bil, puede llamar/localizar a su doctor(a) al n?mero que aparece a continuaci?n.  ? ?Por favor, tenga en cuenta que aunque hacemos todo lo posible para estar disponibles para asuntos urgentes fuera del horario de oficina, no estamos disponibles las 24 horas del d?a, los 7 d?as de la semana.  ? ?Si tiene un problema urgente y no puede comunicarse con nosotros, puede optar por buscar atenci?n m?dica  en el consultorio de su doctor(a), en una cl?nica privada, en un centro de atenci?n urgente o en una sala de emergencias. ? ?Si tiene Engineer, maintenance (IT)  m?dica, por favor llame inmediatamente al 911 o vaya a la sala de emergencias. ? ?N?meros de b?per ? ?- Dr. Nehemiah Massed: [phone] ? ?- Dra. Moye: [phone] ? ?- Dra. Nicole Kindred: [phone] ? ?En caso de inclemencias del tiempo, por favor llame a nuestra l?nea principal al [phone] para una actualizaci?n sobre el estado de cualquier retraso o cierre. ? ?Consejos para la medicaci?n en dermatolog?a: ?Por favor, guarde las cajas en las que vienen los medicamentos de uso t?pico para ayudarle a seguir las instrucciones sobre d?nde y c?mo usarlos. Las farmacias generalmente imprimen las instrucciones del medicamento s?lo en las cajas y no directamente en los tubos del [place].  ? ?Si su medicamento es muy caro, por favor, p?ngase en contacto con Zigmund Daniel llamando al [phone] y presione la opci?n 4 o env?enos un mensaje a trav?s de MyChart.  ? ?No podemos decirle cu?l ser? su copago por los medicamentos por adelantado ya que esto  es diferente dependiendo de la cobertura de su seguro. Sin embargo, es posible que podamos encontrar un medicamento sustituto a Electrical engineer un formulario para que el seguro cubra el medicamento que se considera necesario.  ? ?Si se requiere Ardelia Mems autorizaci?n previa para que su compa??a de seguros [place] su medicamento, por favor perm?tanos de 1 a 2 d?as h?biles para completar este proceso. ? ?Los precios de los medicamentos var?an con frecuencia dependiendo del Environmental consultant de d?nde se surte la receta y alguna farmacias pueden ofrecer precios m?s baratos. ? ?El sitio web www.goodrx.com tiene cupones para medicamentos de Airline pilot. Los precios aqu? no tienen en cuenta lo que podr?a costar con la ayuda del seguro (puede ser m?s barato con su seguro), pero el sitio web puede darle el precio si no utiliz? ning?n seguro.  ?- Puede imprimir el cup?n correspondiente y llevarlo con su receta a la farmacia.  ?- Tambi?n puede pasar por nuestra oficina durante el horario de  atenci?n regular y recoger una tarjeta de cupones de GoodRx.  ?- Si necesita que su receta se env?e electr?nicamente a Chiropodist, informe a nuestra oficina a trav?s Clarence o

## 2021-10-21 ENCOUNTER — Ambulatory Visit: Payer: Self-pay | Admitting: General Surgery

## 2021-10-21 DIAGNOSIS — K6289 Other specified diseases of anus and rectum: Secondary | ICD-10-CM | POA: Diagnosis not present

## 2021-10-21 NOTE — H&P (View-Only) (Signed)
HISTORY OF PRESENT ILLNESS:  ?  ?Summer Bishop is a 71 y.o.female patient who comes for evaluation of hemorrhoids and rectal bleeding. ? ?Patient endorses having multiple episodes of rectal bleeding.  She endorses that this is aggravated by bowel movement.  There is some perianal pain.  No pain radiation.  No alleviating factors.  She has tried multiple creams and suppositories without any improvement.  She does also improve her diet with high-fiber diet. ?   ?  ?PAST MEDICAL HISTORY:  ?Past Medical History:  ?Diagnosis Date  ? Allergic state   ? Anxiety 12/03/2013  ? Arthritis   ? Asthma without status asthmaticus, unspecified   ? Cataract cortical, senile   ? Eczema, unspecified 12/03/2013  ? GERD (gastroesophageal reflux disease) 12/03/2020  ? History of chicken pox   ? Hyperlipidemia   ? Hypertension   ? IBS (irritable bowel syndrome) 12/03/2013  ? Lymphedema of left arm   ? Lymphedema of left leg   ? Migraine 12/03/2013  ? Osteoporosis, post-menopausal   ?  ?  ?  ?PAST SURGICAL HISTORY:   ?Past Surgical History:  ?Procedure Laterality Date  ? TONSILLECTOMY  1955  ? HYSTERECTOMY  2004  ? secondary to bleeding  ? CATARACT EXTRACTION W/  INTRAOCULAR LENS IMPLANT Left 01/02/2019  ? CATARACT EXTRACTION W/  INTRAOCULAR LENS IMPLANT Right 01/16/2019  ? ORIF DISTAL RADIUS FRACTURE Left 03/20/2020  ? Dr. Rudene Christians  ? APPENDECTOMY  1960s  ? EXTRACTION CATARACT EXTRACAPSULAR W/INSERTION INTRAOCULAR PROSTHESIS Bilateral 01/02/2019, 01/16/2019  ?    ?   ?MEDICATIONS:  ?Outpatient Encounter Medications as of 10/21/2021  ?Medication Sig Dispense Refill  ? acetaminophen (TYLENOL) 500 MG tablet Take 1,000 mg by mouth every 8 (eight) hours as needed       ? albuterol 90 mcg/actuation inhaler TAKE 2 PUFFS BY MOUTH EVERY 4 HOURS AS NEEDED FOR WHEEZE OR FOR SHORTNESS OF BREATH & 30 MIN BEFORE 18 g 11  ? alendronate (FOSAMAX) 70 MG tablet TAKE 1 TABLET BY MOUTH EVERY 7 DAYS WITH A FULL GLASS OF WATER. DO NOT LIE DOWN FOR THE NEXT 30 MIN. 12 tablet 1   ? amLODIPine (NORVASC) 10 MG tablet TAKE 1 TABLET BY MOUTH EVERY DAY 90 tablet 1  ? calcium carbonate-vitamin D3 (CALTRATE 600+D) 600 mg-10 mcg (400 unit) tablet Take 2 tablets by mouth once daily      ? diphenhydrAMINE (BENADRYL) 25 mg tablet Take 25 mg by mouth continuously as needed for Itching.    ? hydroCHLOROthiazide (HYDRODIURIL) 25 MG tablet TAKE 1 TABLET BY MOUTH EVERY DAY 90 tablet 1  ? hydrocortisone 2.5 % cream Apply topically    ? multivitamin tablet Take 1 tablet by mouth once daily.    ? triamcinolone 0.1 % ointment Apply topically 2 (two) times daily APPLY TO AFFECTED AREA    ? vit C/E/Zn/coppr/lutein/zeaxan (PRESERVISION AREDS-2 ORAL) Take 2 capsules by mouth once daily    ? ?No facility-administered encounter medications on file as of 10/21/2021.  ? ?  ?ALLERGIES:   ?Ace inhibitors, Aspirin, Latex, Naprosyn [naproxen], Nsaids (non-steroidal anti-inflammatory drug), Sulfa (sulfonamide antibiotics), and Zestril [lisinopril] ?  ?SOCIAL HISTORY:  ?Social History  ? ?Socioeconomic History  ? Marital status: Married  ?Tobacco Use  ? Smoking status: Never  ? Smokeless tobacco: Never  ?Vaping Use  ? Vaping Use: Never used  ?Substance and Sexual Activity  ? Alcohol use: No  ? Drug use: Never  ? Sexual activity: Never  ? ? ?FAMILY HISTORY:  ?  Family History  ?Problem Relation Age of Onset  ? Transient ischemic attack Mother   ? Osteoporosis (Thinning of bones) Mother   ?     Deceased  ? Stroke Mother   ?     Deceased  ? High blood pressure (Hypertension) Mother   ?     Deceased  ? Glaucoma Mother   ?     Deceased  ? Kidney cancer Father   ? Alzheimer's disease Father   ?     Deceased  ? Transient ischemic attack Father   ? Stroke Father   ?     Deceased  ? High blood pressure (Hypertension) Father   ?     Deceased  ? Osteoporosis (Thinning of bones) Father   ?     Deceased  ? Breast cancer Sister   ? Lupus Sister   ?  ? ?GENERAL REVIEW OF SYSTEMS:  ? ?General ROS: negative for - chills, fatigue, fever, weight  gain or weight loss ?Allergy and Immunology ROS: negative for - hives  ?Hematological and Lymphatic ROS: negative for - bleeding problems or bruising, negative for palpable nodes ?Endocrine ROS: negative for - heat or cold intolerance, hair changes ?Respiratory ROS: negative for - cough, shortness of breath or wheezing ?Cardiovascular ROS: no chest pain or palpitations ?GI ROS: negative for nausea, vomiting, abdominal pain, diarrhea, positive for constipation ?Musculoskeletal ROS: negative for - joint swelling or muscle pain ?Neurological ROS: negative for - confusion, syncope ?Dermatological ROS: negative for pruritus and rash ? ?PHYSICAL EXAM:  ?Vitals:  ? 10/21/21 0959  ?BP: (!) 154/94  ?Pulse: 75  ?Marland Kitchen  ?Ht:154.9 cm ('5\' 1"'$ ) Wt:76.2 kg (168 lb) UUV:OZDG surface area is 1.81 meters squared. ?Body mass index is 31.74 kg/m?Marland Kitchen. ?  ?GENERAL: Alert, active, oriented x3 ? ?HEENT: Pupils equal reactive to light. Extraocular movements are intact. Sclera clear. Palpebral conjunctiva normal red color.Pharynx clear. ? ?NECK: Supple with no palpable mass and no adenopathy. ? ?LUNGS: Sound clear with no rales rhonchi or wheezes. ? ?HEART: Regular rhythm S1 and S2 without murmur. ? ?ABDOMEN: Soft and depressible, nontender with no palpable mass, no hepatomegaly.  ? ?RECTAL: There is a polypoid lesion in the left anterior area of the anorectal canal. ? ?EXTREMITIES: Well-developed well-nourished symmetrical with no dependent edema. ? ?NEUROLOGICAL: Awake alert oriented, facial expression symmetrical, moving all extremities. ?  ?   ?IMPRESSION:  ?  ? Mass in rectum [K62.89] ?Patient came with concern of recurrent hemorrhoids.  Physical exam is consistent with a hemorrhoid.  It looks more like a polypoid lesion.  Differential diagnosis is hematuria hemorrhoid.  I discussed with patient that I recommend exam under anesthesia and excision of the anal rectal mass.  Malignancy needs to be ruled out.  Patient last colonoscopy was 5 years  ago.  Patient is due to colonoscopy in September.  The 5 that this is bleeding I think that he should be remove before the colonoscopy.  If this is concerning about any atypia or malignancy colonoscopy will need to be done earlier but if it is benign tissue she will be able to wait until September for her usual colonoscopy.  I discussed with patient the risk of surgery include bleeding, pain, infection, recurrence, among others.  The patient reports she understood and agreed to proceed.        ?  ?PLAN:  ?Excision of anorectal tumor (64403) ?Avoid taking aspirin 5 days before the surgery ?Contact us if you have any concern.  ? ?  Patient verbalized understanding, all questions were answered, and were agreeable with the plan outlined above.  ? ?Herbert Pun, MD ? ?Electronically signed by Herbert Pun, MD ? ?

## 2021-10-21 NOTE — H&P (Signed)
HISTORY OF PRESENT ILLNESS:  ?  ?Summer Bishop is a 71 y.o.female patient who comes for evaluation of hemorrhoids and rectal bleeding. ? ?Patient endorses having multiple episodes of rectal bleeding.  She endorses that this is aggravated by bowel movement.  There is some perianal pain.  No pain radiation.  No alleviating factors.  She has tried multiple creams and suppositories without any improvement.  She does also improve her diet with high-fiber diet. ?   ?  ?PAST MEDICAL HISTORY:  ?Past Medical History:  ?Diagnosis Date  ? Allergic state   ? Anxiety 12/03/2013  ? Arthritis   ? Asthma without status asthmaticus, unspecified   ? Cataract cortical, senile   ? Eczema, unspecified 12/03/2013  ? GERD (gastroesophageal reflux disease) 12/03/2020  ? History of chicken pox   ? Hyperlipidemia   ? Hypertension   ? IBS (irritable bowel syndrome) 12/03/2013  ? Lymphedema of left arm   ? Lymphedema of left leg   ? Migraine 12/03/2013  ? Osteoporosis, post-menopausal   ?  ?  ?  ?PAST SURGICAL HISTORY:   ?Past Surgical History:  ?Procedure Laterality Date  ? TONSILLECTOMY  1955  ? HYSTERECTOMY  2004  ? secondary to bleeding  ? CATARACT EXTRACTION W/  INTRAOCULAR LENS IMPLANT Left 01/02/2019  ? CATARACT EXTRACTION W/  INTRAOCULAR LENS IMPLANT Right 01/16/2019  ? ORIF DISTAL RADIUS FRACTURE Left 03/20/2020  ? Dr. Rudene Christians  ? APPENDECTOMY  1960s  ? EXTRACTION CATARACT EXTRACAPSULAR W/INSERTION INTRAOCULAR PROSTHESIS Bilateral 01/02/2019, 01/16/2019  ?    ?   ?MEDICATIONS:  ?Outpatient Encounter Medications as of 10/21/2021  ?Medication Sig Dispense Refill  ? acetaminophen (TYLENOL) 500 MG tablet Take 1,000 mg by mouth every 8 (eight) hours as needed       ? albuterol 90 mcg/actuation inhaler TAKE 2 PUFFS BY MOUTH EVERY 4 HOURS AS NEEDED FOR WHEEZE OR FOR SHORTNESS OF BREATH & 30 MIN BEFORE 18 g 11  ? alendronate (FOSAMAX) 70 MG tablet TAKE 1 TABLET BY MOUTH EVERY 7 DAYS WITH A FULL GLASS OF WATER. DO NOT LIE DOWN FOR THE NEXT 30 MIN. 12 tablet 1   ? amLODIPine (NORVASC) 10 MG tablet TAKE 1 TABLET BY MOUTH EVERY DAY 90 tablet 1  ? calcium carbonate-vitamin D3 (CALTRATE 600+D) 600 mg-10 mcg (400 unit) tablet Take 2 tablets by mouth once daily      ? diphenhydrAMINE (BENADRYL) 25 mg tablet Take 25 mg by mouth continuously as needed for Itching.    ? hydroCHLOROthiazide (HYDRODIURIL) 25 MG tablet TAKE 1 TABLET BY MOUTH EVERY DAY 90 tablet 1  ? hydrocortisone 2.5 % cream Apply topically    ? multivitamin tablet Take 1 tablet by mouth once daily.    ? triamcinolone 0.1 % ointment Apply topically 2 (two) times daily APPLY TO AFFECTED AREA    ? vit C/E/Zn/coppr/lutein/zeaxan (PRESERVISION AREDS-2 ORAL) Take 2 capsules by mouth once daily    ? ?No facility-administered encounter medications on file as of 10/21/2021.  ? ?  ?ALLERGIES:   ?Ace inhibitors, Aspirin, Latex, Naprosyn [naproxen], Nsaids (non-steroidal anti-inflammatory drug), Sulfa (sulfonamide antibiotics), and Zestril [lisinopril] ?  ?SOCIAL HISTORY:  ?Social History  ? ?Socioeconomic History  ? Marital status: Married  ?Tobacco Use  ? Smoking status: Never  ? Smokeless tobacco: Never  ?Vaping Use  ? Vaping Use: Never used  ?Substance and Sexual Activity  ? Alcohol use: No  ? Drug use: Never  ? Sexual activity: Never  ? ? ?FAMILY HISTORY:  ?  Family History  ?Problem Relation Age of Onset  ? Transient ischemic attack Mother   ? Osteoporosis (Thinning of bones) Mother   ?     Deceased  ? Stroke Mother   ?     Deceased  ? High blood pressure (Hypertension) Mother   ?     Deceased  ? Glaucoma Mother   ?     Deceased  ? Kidney cancer Father   ? Alzheimer's disease Father   ?     Deceased  ? Transient ischemic attack Father   ? Stroke Father   ?     Deceased  ? High blood pressure (Hypertension) Father   ?     Deceased  ? Osteoporosis (Thinning of bones) Father   ?     Deceased  ? Breast cancer Sister   ? Lupus Sister   ?  ? ?GENERAL REVIEW OF SYSTEMS:  ? ?General ROS: negative for - chills, fatigue, fever, weight  gain or weight loss ?Allergy and Immunology ROS: negative for - hives  ?Hematological and Lymphatic ROS: negative for - bleeding problems or bruising, negative for palpable nodes ?Endocrine ROS: negative for - heat or cold intolerance, hair changes ?Respiratory ROS: negative for - cough, shortness of breath or wheezing ?Cardiovascular ROS: no chest pain or palpitations ?GI ROS: negative for nausea, vomiting, abdominal pain, diarrhea, positive for constipation ?Musculoskeletal ROS: negative for - joint swelling or muscle pain ?Neurological ROS: negative for - confusion, syncope ?Dermatological ROS: negative for pruritus and rash ? ?PHYSICAL EXAM:  ?Vitals:  ? 10/21/21 0959  ?BP: (!) 154/94  ?Pulse: 75  ?Marland Kitchen  ?Ht:154.9 cm ('5\' 1"'$ ) Wt:76.2 kg (168 lb) GQQ:PYPP surface area is 1.81 meters squared. ?Body mass index is 31.74 kg/m?Marland Kitchen. ?  ?GENERAL: Alert, active, oriented x3 ? ?HEENT: Pupils equal reactive to light. Extraocular movements are intact. Sclera clear. Palpebral conjunctiva normal red color.Pharynx clear. ? ?NECK: Supple with no palpable mass and no adenopathy. ? ?LUNGS: Sound clear with no rales rhonchi or wheezes. ? ?HEART: Regular rhythm S1 and S2 without murmur. ? ?ABDOMEN: Soft and depressible, nontender with no palpable mass, no hepatomegaly.  ? ?RECTAL: There is a polypoid lesion in the left anterior area of the anorectal canal. ? ?EXTREMITIES: Well-developed well-nourished symmetrical with no dependent edema. ? ?NEUROLOGICAL: Awake alert oriented, facial expression symmetrical, moving all extremities. ?  ?   ?IMPRESSION:  ?  ? Mass in rectum [K62.89] ?Patient came with concern of recurrent hemorrhoids.  Physical exam is consistent with a hemorrhoid.  It looks more like a polypoid lesion.  Differential diagnosis is hematuria hemorrhoid.  I discussed with patient that I recommend exam under anesthesia and excision of the anal rectal mass.  Malignancy needs to be ruled out.  Patient last colonoscopy was 5 years  ago.  Patient is due to colonoscopy in September.  The 5 that this is bleeding I think that he should be remove before the colonoscopy.  If this is concerning about any atypia or malignancy colonoscopy will need to be done earlier but if it is benign tissue she will be able to wait until September for her usual colonoscopy.  I discussed with patient the risk of surgery include bleeding, pain, infection, recurrence, among others.  The patient reports she understood and agreed to proceed.        ?  ?PLAN:  ?Excision of anorectal tumor (50932) ?Avoid taking aspirin 5 days before the surgery ?Contact us if you have any concern.  ? ?  Patient verbalized understanding, all questions were answered, and were agreeable with the plan outlined above.  ? ?Herbert Pun, MD ? ?Electronically signed by Herbert Pun, MD ? ?

## 2021-10-25 ENCOUNTER — Encounter
Admission: RE | Admit: 2021-10-25 | Discharge: 2021-10-25 | Disposition: A | Discharge status: Home / Self Care | Payer: PPO | Source: Ambulatory Visit | Attending: General Surgery | Admitting: General Surgery

## 2021-10-25 ENCOUNTER — Encounter: Payer: Self-pay | Admitting: Urgent Care

## 2021-10-25 VITALS — Ht 62.0 in | Wt 166.0 lb

## 2021-10-25 DIAGNOSIS — I10 Essential (primary) hypertension: Secondary | ICD-10-CM | POA: Insufficient documentation

## 2021-10-25 DIAGNOSIS — Z01812 Encounter for preprocedural laboratory examination: Secondary | ICD-10-CM

## 2021-10-25 DIAGNOSIS — Z01818 Encounter for other preprocedural examination: Secondary | ICD-10-CM | POA: Insufficient documentation

## 2021-10-25 DIAGNOSIS — R54 Age-related physical debility: Secondary | ICD-10-CM | POA: Diagnosis not present

## 2021-10-25 HISTORY — DX: Other specified postprocedural states: Z98.890

## 2021-10-25 HISTORY — DX: Pure hypercholesterolemia, unspecified: E78.00

## 2021-10-25 HISTORY — DX: Unspecified osteoarthritis, unspecified site: M19.90

## 2021-10-25 HISTORY — DX: Nausea with vomiting, unspecified: R11.2

## 2021-10-25 HISTORY — DX: Prediabetes: R73.03

## 2021-10-25 HISTORY — DX: Gastro-esophageal reflux disease without esophagitis: K21.9

## 2021-10-25 HISTORY — DX: Cortical age-related cataract, bilateral: H25.013

## 2021-10-25 HISTORY — DX: Hyperlipidemia, unspecified: E78.5

## 2021-10-25 HISTORY — DX: Irritable bowel syndrome, unspecified: K58.9

## 2021-10-25 HISTORY — DX: Age-related osteoporosis without current pathological fracture: M81.0

## 2021-10-25 LAB — CBC
HCT: 43.9 % (ref 36.0–46.0)
Hemoglobin: 14.8 g/dL (ref 12.0–15.0)
MCH: 29.7 pg (ref 26.0–34.0)
MCHC: 33.7 g/dL (ref 30.0–36.0)
MCV: 88.2 fL (ref 80.0–100.0)
Platelets: 317 10*3/uL (ref 150–400)
RBC: 4.98 MIL/uL (ref 3.87–5.11)
RDW: 14 % (ref 11.5–15.5)
WBC: 7.8 10*3/uL (ref 4.0–10.5)
nRBC: 0 % (ref 0.0–0.2)

## 2021-10-25 NOTE — Patient Instructions (Addendum)
Your procedure is scheduled on:Wednesday, April 5 ?Report to the Registration Desk on the 1st floor of the [facility]. ?To find out your arrival time, please call [phone] between 1PM - 3PM on: Tuesday, April 4 ? ?REMEMBER: ?Instructions that are not followed completely may result in serious medical risk, up to and including death; or upon the discretion of your surgeon and anesthesiologist your surgery may need to be rescheduled. ? ?Do not eat or drink after midnight the night before surgery.  ?No gum chewing, lozengers or hard candies. ? ?TAKE THESE MEDICATIONS THE MORNING OF SURGERY WITH A SIP OF WATER: ? ?Albuterol inhaler ?Amlodipine ? ?Use inhalers on the day of surgery and bring to the hospital. ? ?Bowel prep as directed by your surgeon. ? ?One week prior to surgery: ?Stop Anti-inflammatories (NSAIDS) such as Advil, Aleve, Ibuprofen, Motrin, Naproxen, Naprosyn and Aspirin based products such as Excedrin, Goodys Powder, BC Powder. ?Stop ANY OVER THE COUNTER supplements until after surgery. Stop Calcium,vitamin D, multiple vitamins, probiotic ?You may however, continue to take Tylenol if needed for pain up until the day of surgery. ? ?No Alcohol for 24 hours before or after surgery. ? ?No Smoking including e-cigarettes for 24 hours prior to surgery.  ?No chewable tobacco products for at least 6 hours prior to surgery.  ?No nicotine patches on the day of surgery. ? ?Do not use any "recreational" drugs for at least a week prior to your surgery.  ?Please be advised that the combination of cocaine and anesthesia may have negative outcomes, up to and including death. ?If you test positive for cocaine, your surgery will be cancelled. ? ?On the morning of surgery brush your teeth with toothpaste and water, you may rinse your mouth with mouthwash if you wish. ?Do not swallow any toothpaste or mouthwash. ? ?Do not wear jewelry, make-up, hairpins, clips or nail polish. ? ?Do not wear lotions, powders, or  perfumes.  ? ?Do not shave body from the neck down 48 hours prior to surgery just in case you cut yourself which could leave a site for infection.  ? ?Contact lenses, hearing aids and dentures may not be worn into surgery. ? ?Do not bring valuables to the hospital. [facility] is not responsible for any missing/lost belongings or valuables.  ? ?Notify your doctor if there is any change in your medical condition (cold, fever, infection). ? ?Wear comfortable clothing (specific to your surgery type) to the hospital. ? ?After surgery, you can help prevent lung complications by doing breathing exercises.  ?Take deep breaths and cough every 1-2 hours. Your doctor may order a device called an Incentive Spirometer to help you take deep breaths. ? ?If you are being discharged the day of surgery, you will not be allowed to drive home. ?You will need a responsible adult (18 years or older) to drive you home and stay with you that night.  ? ?If you are taking public transportation, you will need to have a responsible adult (18 years or older) with you. ?Please confirm with your physician that it is acceptable to use public transportation.  ? ?Please call the [facility] Dept. at [phone] if you have any questions about these instructions. ? ?Surgery Visitation Policy: ? ?Patients undergoing a surgery or procedure may have two family members or support persons with them as long as the person is not COVID-19 positive or experiencing its symptoms.  ?

## 2021-10-27 ENCOUNTER — Encounter
Admission: RE | Disposition: A | Discharge status: Home / Self Care | Payer: Self-pay | Source: Home / Self Care | Attending: General Surgery

## 2021-10-27 ENCOUNTER — Ambulatory Visit: Payer: PPO | Admitting: Anesthesiology

## 2021-10-27 ENCOUNTER — Other Ambulatory Visit: Payer: Self-pay

## 2021-10-27 ENCOUNTER — Ambulatory Visit
Admission: RE | Admit: 2021-10-27 | Discharge: 2021-10-27 | Disposition: A | Discharge status: Home / Self Care | Payer: PPO | Attending: General Surgery | Admitting: General Surgery

## 2021-10-27 DIAGNOSIS — F419 Anxiety disorder, unspecified: Secondary | ICD-10-CM | POA: Diagnosis not present

## 2021-10-27 DIAGNOSIS — J45909 Unspecified asthma, uncomplicated: Secondary | ICD-10-CM | POA: Diagnosis not present

## 2021-10-27 DIAGNOSIS — K219 Gastro-esophageal reflux disease without esophagitis: Secondary | ICD-10-CM | POA: Insufficient documentation

## 2021-10-27 DIAGNOSIS — I10 Essential (primary) hypertension: Secondary | ICD-10-CM | POA: Diagnosis not present

## 2021-10-27 DIAGNOSIS — K6289 Other specified diseases of anus and rectum: Secondary | ICD-10-CM | POA: Insufficient documentation

## 2021-10-27 DIAGNOSIS — D129 Benign neoplasm of anus and anal canal: Secondary | ICD-10-CM | POA: Diagnosis not present

## 2021-10-27 DIAGNOSIS — Z79899 Other long term (current) drug therapy: Secondary | ICD-10-CM | POA: Diagnosis not present

## 2021-10-27 SURGERY — EXAM UNDER ANESTHESIA
Anesthesia: General | Site: Rectum

## 2021-10-27 MED ORDER — CHLORHEXIDINE GLUCONATE 0.12 % MT SOLN
OROMUCOSAL | Status: AC
  Administered 2021-10-27: 15 mL via OROMUCOSAL
  Filled 2021-10-27: qty 15

## 2021-10-27 MED ORDER — GELATIN ABSORBABLE 12-7 MM EX MISC
CUTANEOUS | Status: DC | PRN
  Administered 2021-10-27: 1

## 2021-10-27 MED ORDER — PROPOFOL 10 MG/ML IV BOLUS
INTRAVENOUS | Status: AC
  Filled 2021-10-27: qty 20

## 2021-10-27 MED ORDER — ONDANSETRON HCL 4 MG/2ML IJ SOLN
INTRAMUSCULAR | Status: AC
  Filled 2021-10-27: qty 2

## 2021-10-27 MED ORDER — GELATIN ABSORBABLE 12-7 MM EX MISC
CUTANEOUS | Status: AC
  Filled 2021-10-27: qty 1

## 2021-10-27 MED ORDER — FAMOTIDINE 20 MG PO TABS: 20.0000 mg | ORAL_TABLET | Freq: Once | ORAL | Status: AC

## 2021-10-27 MED ORDER — MIDAZOLAM HCL 2 MG/2ML IJ SOLN
INTRAMUSCULAR | Status: AC
Start: 2021-10-27 — End: ?
  Filled 2021-10-27: qty 2

## 2021-10-27 MED ORDER — OXYCODONE HCL 5 MG PO TABS: 5.0000 mg | ORAL_TABLET | Freq: Once | ORAL | Status: DC | PRN

## 2021-10-27 MED ORDER — DEXAMETHASONE SODIUM PHOSPHATE 10 MG/ML IJ SOLN
INTRAMUSCULAR | Status: AC
  Filled 2021-10-27: qty 1

## 2021-10-27 MED ORDER — PROMETHAZINE HCL 25 MG/ML IJ SOLN: 6.2500 mg | INTRAMUSCULAR | Status: DC | PRN

## 2021-10-27 MED ORDER — CHLORHEXIDINE GLUCONATE 0.12 % MT SOLN: 15.0000 mL | Freq: Once | OROMUCOSAL | Status: AC

## 2021-10-27 MED ORDER — SUCCINYLCHOLINE CHLORIDE 200 MG/10ML IV SOSY
PREFILLED_SYRINGE | INTRAVENOUS | Status: DC | PRN
  Administered 2021-10-27: 80 mg via INTRAVENOUS

## 2021-10-27 MED ORDER — PHENYLEPHRINE 40 MCG/ML (10ML) SYRINGE FOR IV PUSH (FOR BLOOD PRESSURE SUPPORT)
PREFILLED_SYRINGE | INTRAVENOUS | Status: DC | PRN
  Administered 2021-10-27: 80 ug via INTRAVENOUS
  Administered 2021-10-27: 40 ug via INTRAVENOUS
  Administered 2021-10-27 (×3): 80 ug via INTRAVENOUS

## 2021-10-27 MED ORDER — FENTANYL CITRATE (PF) 100 MCG/2ML IJ SOLN
INTRAMUSCULAR | Status: AC
  Filled 2021-10-27: qty 2

## 2021-10-27 MED ORDER — LIDOCAINE HCL (CARDIAC) PF 100 MG/5ML IV SOSY
PREFILLED_SYRINGE | INTRAVENOUS | Status: DC | PRN
  Administered 2021-10-27: 80 mg via INTRAVENOUS

## 2021-10-27 MED ORDER — CIPROFLOXACIN IN D5W 400 MG/200ML IV SOLN
INTRAVENOUS | Status: AC
  Filled 2021-10-27: qty 200

## 2021-10-27 MED ORDER — BUPIVACAINE-EPINEPHRINE (PF) 0.5% -1:200000 IJ SOLN
INTRAMUSCULAR | Status: DC | PRN
  Administered 2021-10-27: 50 mL via INTRAMUSCULAR

## 2021-10-27 MED ORDER — LIDOCAINE HCL (PF) 2 % IJ SOLN
INTRAMUSCULAR | Status: AC
  Filled 2021-10-27: qty 5

## 2021-10-27 MED ORDER — METRONIDAZOLE 500 MG/100ML IV SOLN
500.0000 mg | INTRAVENOUS | Status: AC
  Administered 2021-10-27: 500 mg via INTRAVENOUS
  Filled 2021-10-27 (×2): qty 100

## 2021-10-27 MED ORDER — OXYCODONE HCL 5 MG/5ML PO SOLN: 5.0000 mg | Freq: Once | ORAL | Status: DC | PRN

## 2021-10-27 MED ORDER — APREPITANT 40 MG PO CAPS
ORAL_CAPSULE | ORAL | Status: AC
  Administered 2021-10-27: 40 mg via ORAL
  Filled 2021-10-27: qty 1

## 2021-10-27 MED ORDER — LACTATED RINGERS IV SOLN: INTRAVENOUS | Status: DC

## 2021-10-27 MED ORDER — 0.9 % SODIUM CHLORIDE (POUR BTL) OPTIME
TOPICAL | Status: DC | PRN
  Administered 2021-10-27: 500 mL

## 2021-10-27 MED ORDER — PROPOFOL 10 MG/ML IV BOLUS
INTRAVENOUS | Status: DC | PRN
  Administered 2021-10-27: 150 mg via INTRAVENOUS

## 2021-10-27 MED ORDER — FAMOTIDINE 20 MG PO TABS
ORAL_TABLET | ORAL | Status: AC
  Administered 2021-10-27: 20 mg via ORAL
  Filled 2021-10-27: qty 1

## 2021-10-27 MED ORDER — LIDOCAINE HCL 4 % EX SOLN
CUTANEOUS | Status: DC | PRN
  Administered 2021-10-27: 3 mL via TOPICAL

## 2021-10-27 MED ORDER — BUPIVACAINE-EPINEPHRINE (PF) 0.5% -1:200000 IJ SOLN
INTRAMUSCULAR | Status: AC
  Filled 2021-10-27: qty 30

## 2021-10-27 MED ORDER — ORAL CARE MOUTH RINSE: 15.0000 mL | Freq: Once | OROMUCOSAL | Status: AC

## 2021-10-27 MED ORDER — SUCCINYLCHOLINE CHLORIDE 200 MG/10ML IV SOSY
PREFILLED_SYRINGE | INTRAVENOUS | Status: AC
  Filled 2021-10-27: qty 10

## 2021-10-27 MED ORDER — APREPITANT 40 MG PO CAPS: 40.0000 mg | ORAL_CAPSULE | Freq: Once | ORAL | Status: AC

## 2021-10-27 MED ORDER — BUPIVACAINE LIPOSOME 1.3 % IJ SUSP
INTRAMUSCULAR | Status: AC
  Filled 2021-10-27: qty 20

## 2021-10-27 MED ORDER — EPHEDRINE SULFATE (PRESSORS) 50 MG/ML IJ SOLN
INTRAMUSCULAR | Status: DC | PRN
  Administered 2021-10-27 (×2): 5 mg via INTRAVENOUS

## 2021-10-27 MED ORDER — FENTANYL CITRATE (PF) 100 MCG/2ML IJ SOLN
INTRAMUSCULAR | Status: DC | PRN
  Administered 2021-10-27: 50 ug via INTRAVENOUS

## 2021-10-27 MED ORDER — FENTANYL CITRATE (PF) 100 MCG/2ML IJ SOLN: 25.0000 ug | INTRAMUSCULAR | Status: DC | PRN

## 2021-10-27 MED ORDER — TRAMADOL HCL 50 MG PO TABS: 50.0000 mg | ORAL_TABLET | Freq: Four times a day (QID) | ORAL | 0 refills | Status: DC | PRN

## 2021-10-27 MED ORDER — CIPROFLOXACIN IN D5W 400 MG/200ML IV SOLN
400.0000 mg | INTRAVENOUS | Status: AC
  Administered 2021-10-27: 400 mg via INTRAVENOUS

## 2021-10-27 MED ORDER — ONDANSETRON HCL 4 MG/2ML IJ SOLN
INTRAMUSCULAR | Status: DC | PRN
  Administered 2021-10-27: 4 mg via INTRAVENOUS

## 2021-10-27 MED ORDER — DEXAMETHASONE SODIUM PHOSPHATE 10 MG/ML IJ SOLN
INTRAMUSCULAR | Status: DC | PRN
  Administered 2021-10-27: 10 mg via INTRAVENOUS

## 2021-10-27 SURGICAL SUPPLY — 23 items
DRAPE LAPAROTOMY 100X77 ABD (DRAPES) ×2 IMPLANT
DRAPE LEGGINS SURG 28X43 STRL (DRAPES) ×2 IMPLANT
DRAPE UNDER BUTTOCK W/FLU (DRAPES) ×2 IMPLANT
ELECT REM PT RETURN 9FT ADLT (ELECTROSURGICAL) ×2
ELECTRODE REM PT RTRN 9FT ADLT (ELECTROSURGICAL) ×1 IMPLANT
GAUZE 4X4 16PLY ~~LOC~~+RFID DBL (SPONGE) ×2 IMPLANT
GLOVE SURG ENC MOIS LTX SZ6.5 (GLOVE) ×2 IMPLANT
GLOVE SURG UNDER POLY LF SZ6.5 (GLOVE) ×2 IMPLANT
GOWN STRL REUS W/ TWL LRG LVL3 (GOWN DISPOSABLE) ×2 IMPLANT
GOWN STRL REUS W/TWL LRG LVL3 (GOWN DISPOSABLE) ×4
MANIFOLD NEPTUNE II (INSTRUMENTS) ×2 IMPLANT
NEEDLE HYPO 22GX1.5 SAFETY (NEEDLE) ×2 IMPLANT
PACK BASIN MINOR ARMC (MISCELLANEOUS) ×2 IMPLANT
PAD OB MATERNITY 4.3X12.25 (PERSONAL CARE ITEMS) ×2 IMPLANT
PAD PREP 24X41 OB/GYN DISP (PERSONAL CARE ITEMS) ×2 IMPLANT
PANTS MESH DISP 2XL (UNDERPADS AND DIAPERS) ×1 IMPLANT
PANTS MESH DISPOSABLE 2XL (UNDERPADS AND DIAPERS) ×1
SPONGE SURGIFOAM ABS GEL 12-7 (HEMOSTASIS) ×1 IMPLANT
STRAP SAFETY 5IN WIDE (MISCELLANEOUS) ×2 IMPLANT
SURGILUBE 2OZ TUBE FLIPTOP (MISCELLANEOUS) ×2 IMPLANT
SUT VIC AB 3-0 SH 27 (SUTURE)
SUT VIC AB 3-0 SH 27X BRD (SUTURE) IMPLANT
SYR 10ML LL (SYRINGE) ×2 IMPLANT

## 2021-10-27 NOTE — Anesthesia Procedure Notes (Signed)
Procedure Name: Intubation ?Date/Time: 10/27/2021 1:07 PM ?Performed by: Marshell Garfinkel, RN ?Pre-anesthesia Checklist: Patient identified, Emergency Drugs available, Suction available and Patient being monitored ?Patient Re-evaluated:Patient Re-evaluated prior to induction ?Oxygen Delivery Method: Circle system utilized ?Preoxygenation: Pre-oxygenation with 100% oxygen ?Induction Type: IV induction ?Ventilation: Mask ventilation without difficulty and Oral airway inserted - appropriate to patient size ?Laryngoscope Size: Mac and 3 ?Grade View: Grade I ?Tube type: Oral ?Tube size: 7.0 mm ?Number of attempts: 1 ?Airway Equipment and Method: Stylet and Oral airway ?Placement Confirmation: ETT inserted through vocal cords under direct vision, positive ETCO2 and breath sounds checked- equal and bilateral ?Secured at: 21 cm ?Tube secured with: Tape ?Dental Injury: Teeth and Oropharynx as per pre-operative assessment  ? ? ? ? ?

## 2021-10-27 NOTE — Anesthesia Postprocedure Evaluation (Signed)
Anesthesia Post Note ? ?Patient: Summer Bishop ? ?Procedure(s) Performed: EXAM UNDER ANESTHESIA (Rectum) ? ?Patient location during evaluation: PACU ?Anesthesia Type: General ?Level of consciousness: awake and alert ?Pain management: pain level controlled ?Vital Signs Assessment: post-procedure vital signs reviewed and stable ?Respiratory status: spontaneous breathing, nonlabored ventilation and respiratory function stable ?Cardiovascular status: blood pressure returned to baseline and stable ?Postop Assessment: no apparent nausea or vomiting ?Anesthetic complications: no ? ? ?No notable events documented. ? ? ?Last Vitals:  ?Vitals:  ? 10/27/21 1447 10/27/21 1452  ?BP:  (!) 146/80  ?Pulse: 85 82  ?Resp: 20 20  ?Temp:  (!) 36.1 ?C  ?SpO2: 93% 95%  ?  ?Last Pain:  ?Vitals:  ? 10/27/21 1452  ?TempSrc: Temporal  ?PainSc: 0-No pain  ? ? ?  ?  ?  ?  ?  ?  ? ?Iran Ouch ? ? ? ? ?

## 2021-10-27 NOTE — Discharge Instructions (Addendum)
?  Diet: Resume home heart healthy regular diet.  ? ?Activity: Increase activity as tolerated. Llight activity and walking are encouraged. Do not drive or drink alcohol if taking narcotic pain medications. ? ?Wound care: May shower with soapy water and pat dry (do not rub incisions), but no baths or submerging incision underwater until follow-up. (no swimming)  ? ?Medications: Resume all home medications. For mild to moderate pain: acetaminophen (Tylenol) or ibuprofen (if no kidney disease). Combining Tylenol with alcohol can substantially increase your risk of causing liver disease. Narcotic pain medications, if prescribed, can be used for severe pain, though may cause nausea, constipation, and drowsiness. If you do not need the narcotic pain medication, you do not need to fill the prescription. ? ?Call office [phone]) at any time if any questions, worsening pain, fevers/chills, bleeding, drainage from incision site, or other concerns. ? ? ?AMBULATORY SURGERY  ?DISCHARGE INSTRUCTIONS ? ? ?The drugs that you were given will stay in your system until tomorrow so for the next 24 hours you should not: ? ?Drive an automobile ?Make any legal decisions ?Drink any alcoholic beverage ? ? ?You may resume regular meals tomorrow.  Today it is better to start with liquids and gradually work up to solid foods. ? ?You may eat anything you prefer, but it is better to start with liquids, then soup and crackers, and gradually work up to solid foods. ? ? ?Please notify your doctor immediately if you have any unusual bleeding, trouble breathing, redness and pain at the surgery site, drainage, fever, or pain not relieved by medication. ? ? ? ?Additional Instructions: ? ?Please contact your physician with any problems or Same Day Surgery at [phone], Monday through Friday 6 am to 4 pm, or [place] at [facility] number at [phone].  ? ?

## 2021-10-27 NOTE — Anesthesia Preprocedure Evaluation (Addendum)
Anesthesia Evaluation  ?Patient identified by MRN, date of birth, ID band ?Patient awake ? ? ? ?Reviewed: ?Allergy & Precautions, H&P , NPO status , Patient's Chart, lab work & pertinent test results ? ?History of Anesthesia Complications ?(+) PONV and history of anesthetic complications ? ?Airway ?Mallampati: III ? ?TM Distance: >3 FB ? ? ? ? Dental ? ?(+) Teeth Intact ?  ?Pulmonary ?asthma , neg sleep apnea, neg COPD,  ?  ?breath sounds clear to auscultation ? ? ? ? ? ? Cardiovascular ?Exercise Tolerance: Good ?hypertension, Pt. on medications ?(-) angina(-) Past MI and (-) Cardiac Stents (-) dysrhythmias  ?Rhythm:regular Rate:Normal ? ?Lymphedema ?  ?Neuro/Psych ? Headaches, Anxiety Hereditary and idiopathic peripheral neuropathy ? Neuromuscular disease   ? GI/Hepatic ?Neg liver ROS, GERD  Medicated and Poorly Controlled,  ?Endo/Other  ?negative endocrine ROS ? Renal/GU ?  ? ?  ?Musculoskeletal ? ? Abdominal ?(+) + obese,   ?Peds ? Hematology ?negative hematology ROS ?(+)   ?Anesthesia Other Findings ?Mass in rectum ? ?Past Medical History: ?No date: Asthma ?No date: Chicken pox ?No date: Headache ?No date: Hypertension ?No date: Lymphedema ? ?Past Surgical History: ?No date: ABDOMINAL HYSTERECTOMY ?No date: APPENDECTOMY ?03/29/2017: COLONOSCOPY WITH PROPOFOL; N/A ?    Comment:  Procedure: COLONOSCOPY WITH PROPOFOL;  Surgeon: Marius Ditch,  ?             Tally Due, MD;  Location: [facility] ENDOSCOPY;  Service:  ?             Gastroenterology;  Laterality: N/A; ?No date: TONSILLECTOMY ? ?BMI   ? Body Mass Index: 28.72 kg/m?  ?  ? ? Reproductive/Obstetrics ?negative OB ROS ? ?  ? ? ? ? ? ? ? ? ? ? ? ? ? ?  ?  ? ? ? ? ? ? ? ?Anesthesia Physical ? ?Anesthesia Plan ? ?ASA: II ? ?Anesthesia Plan: General  ? ?Post-op Pain Management:   ? ?Induction: Intravenous ? ?PONV Risk Score and Plan: Treatment may vary due to age or medical condition, Propofol infusion and TIVA ? ?Airway Management Planned:  Oral ETT ? ?Additional Equipment:  ? ?Intra-op Plan:  ? ?Post-operative Plan: Extubation in OR ? ?Informed Consent: I have reviewed the patients History and Physical, chart, labs and discussed the procedure including the risks, benefits and alternatives for the proposed anesthesia with the patient or authorized representative who has indicated his/her understanding and acceptance.  ? ? ? ?Dental Advisory Given and Dental advisory given ? ?Plan Discussed with: Anesthesiologist, CRNA and Surgeon ? ?Anesthesia Plan Comments:   ? ? ? ? ? ?Anesthesia Quick Evaluation ? ?

## 2021-10-27 NOTE — Op Note (Signed)
Preoperative diagnosis: Anorectal mass ? ?Postoperative diagnosis: Anorectal mass ? ?Procedure: Rectal exam under anesthesia ?                    Excision of rectal mass/polyp ? ?Surgeon: Dr. Windell Moment ? ?Anesthesia: General ? ?Wound classification: Clean Contaminated ? ?Indications: Patient is a 72 y.o. female was found to have rectal bleeding and on rectal exam she was found with a rectal polyp at the 11 o'clock position..  ? ?Findings: ?1.  On lithotomy, at the 11 o'clock position there was a 1 cm polypoid mass with friable, bleeding tissue. ?2.  No other pathology is identified. ?3. Adequate hemostasis ? ?Description of procedure: The patient was brought to the operating room and spinal anesthesia was induced. Patient was placed in the supine position. A time-out was completed verifying correct patient, procedure, site, positioning, and implant(s) and/or special equipment prior to beginning this procedure. The buttocks were taped apart.  ?The perineum was prepped and draped in standard sterile fashion. Local anesthetic was injected as a perianal block. An anoscope was introduced and the polypoid lesion was identified at the 11 o'clock position. A Kelly clamp was placed near the base of each pedicle near the dentate line and retracted externally to exteriorize the polypoid mass.  ?An elliptical incision was made around the polypoid mass to be excised completely. Hemostasis was achieved using electrocautery. Following hemostasis, the mucosal incisions were closed with a running lock stitch of 3-0 Vicryl on the mucosal aspect. The anal canal was then injected with local anesthetic. A gauze pad was tucked between the gluteal folds.  ?The patient tolerated the procedure well and was taken to the postanesthesia care unit in stable condition.  ? ?Specimen: Anorectal mass ? ?Complications: none ? ?EBL: 10 mL ? ?

## 2021-10-27 NOTE — Transfer of Care (Signed)
Immediate Anesthesia Transfer of Care Note ? ?Patient: Summer Bishop ? ?Procedure(s) Performed: EXAM UNDER ANESTHESIA (Rectum) ? ?Patient Location: PACU ? ?Anesthesia Type:General ? ?Level of Consciousness: sedated, drowsy and patient cooperative ? ?Airway & Oxygen Therapy: Patient Spontanous Breathing and Patient connected to face mask oxygen ? ?Post-op Assessment: Report given to RN and Post -op Vital signs reviewed and stable ? ?Post vital signs: Reviewed and stable ? ?Last Vitals:  ?Vitals Value Taken Time  ?BP 106/65 10/27/21 1354  ?Temp 36.4 ?C 10/27/21 1354  ?Pulse 85 10/27/21 1358  ?Resp 14 10/27/21 1358  ?SpO2 100 % 10/27/21 1358  ? ? ?Last Pain:  ?Vitals:  ? 10/27/21 1354  ?TempSrc:   ?PainSc: Asleep  ?   ? ?  ? ?Complications: No notable events documented. ?

## 2021-10-27 NOTE — Interval H&P Note (Signed)
History and Physical Interval Note: ? ?10/27/2021 ?12:48 PM ? ?Summer Bishop  has presented today for surgery, with the diagnosis of K65.89 Mass in rectum.  The various methods of treatment have been discussed with the patient and family. After consideration of risks, benefits and other options for treatment, the patient has consented to  Procedure(s): ?EXAM UNDER ANESTHESIA (N/A) as a surgical intervention.  The patient's history has been reviewed, patient examined, no change in status, stable for surgery.  I have reviewed the patient's chart and labs.  Questions were answered to the patient's satisfaction.   ? ? ?[name] ? ? ?

## 2021-10-29 LAB — SURGICAL PATHOLOGY

## 2021-11-30 ENCOUNTER — Ambulatory Visit: Payer: PPO | Admitting: Dermatology

## 2022-04-04 DIAGNOSIS — H6692 Otitis media, unspecified, left ear: Secondary | ICD-10-CM | POA: Diagnosis not present

## 2022-04-04 DIAGNOSIS — I10 Essential (primary) hypertension: Secondary | ICD-10-CM | POA: Diagnosis not present

## 2022-04-12 DIAGNOSIS — R7303 Prediabetes: Secondary | ICD-10-CM | POA: Diagnosis not present

## 2022-04-12 DIAGNOSIS — I10 Essential (primary) hypertension: Secondary | ICD-10-CM | POA: Diagnosis not present

## 2022-04-19 DIAGNOSIS — R7303 Prediabetes: Secondary | ICD-10-CM | POA: Diagnosis not present

## 2022-04-19 DIAGNOSIS — J45990 Exercise induced bronchospasm: Secondary | ICD-10-CM | POA: Diagnosis not present

## 2022-04-19 DIAGNOSIS — E7800 Pure hypercholesterolemia, unspecified: Secondary | ICD-10-CM | POA: Diagnosis not present

## 2022-04-19 DIAGNOSIS — M810 Age-related osteoporosis without current pathological fracture: Secondary | ICD-10-CM | POA: Diagnosis not present

## 2022-04-19 DIAGNOSIS — K582 Mixed irritable bowel syndrome: Secondary | ICD-10-CM | POA: Diagnosis not present

## 2022-04-19 DIAGNOSIS — I10 Essential (primary) hypertension: Secondary | ICD-10-CM | POA: Diagnosis not present

## 2022-04-19 DIAGNOSIS — Z23 Encounter for immunization: Secondary | ICD-10-CM | POA: Diagnosis not present

## 2022-05-23 ENCOUNTER — Encounter (INDEPENDENT_AMBULATORY_CARE_PROVIDER_SITE_OTHER): Payer: Self-pay

## 2022-08-09 ENCOUNTER — Ambulatory Visit (INDEPENDENT_AMBULATORY_CARE_PROVIDER_SITE_OTHER): Payer: PPO | Admitting: Vascular Surgery

## 2022-08-09 ENCOUNTER — Encounter (INDEPENDENT_AMBULATORY_CARE_PROVIDER_SITE_OTHER): Payer: Self-pay | Admitting: Vascular Surgery

## 2022-08-09 VITALS — BP 149/78 | HR 71 | Ht 62.0 in | Wt 165.0 lb

## 2022-08-09 DIAGNOSIS — I890 Lymphedema, not elsewhere classified: Secondary | ICD-10-CM | POA: Diagnosis not present

## 2022-08-09 DIAGNOSIS — I10 Essential (primary) hypertension: Secondary | ICD-10-CM | POA: Diagnosis not present

## 2022-08-09 NOTE — Assessment & Plan Note (Signed)
In general, she has good symptom control with her lymphedema pump as well as compression and elevation.  Her pump had dysfunction last night for the first time.  If this becomes a recurring problem, we will need to get the company out there to check the machine as it is quite old.  She has been using it for about 5 years now.  I will plan on seeing her back in 1 year.

## 2022-08-09 NOTE — Progress Notes (Signed)
MRN : [id number]  Summer Bishop is a 72 y.o. (1950-11-16) female who presents with chief complaint of  Chief Complaint  Patient presents with   Follow-up  .  History of Present Illness: Patient returns today in follow up of her lymphedema and leg swelling.  In general, the use of compression socks, elevation, and her lymphedema pump have done an excellent job of keeping her swelling under control.  She has had no ulceration or infection.  No chest pain or shortness of breath.  She had to stop using the pumps for a few months back in the early fall due to sciatica but her swelling quickly resolved after resuming the pumps.  Last night, she had her pump essentially get stuck in the pressure mode and was very uncomfortable.  That is the first time this has happened.  Current Outpatient Medications  Medication Sig Dispense Refill   acetaminophen (TYLENOL) 500 MG tablet Take 500-1,000 mg by mouth every 6 (six) hours as needed for mild pain or headache.     albuterol (PROVENTIL HFA;VENTOLIN HFA) 108 (90 Base) MCG/ACT inhaler Inhale 2 puffs into the lungs every 6 (six) hours as needed for wheezing or shortness of breath. 1 Inhaler 0   alendronate (FOSAMAX) 70 MG tablet Take 1 tablet (70 mg total) by mouth every 7 (seven) days. Take with a full glass of water on an empty stomach. (Patient taking differently: Take 70 mg by mouth every Saturday. Take with a full glass of water on an empty stomach.) 4 tablet 11   amLODipine (NORVASC) 10 MG tablet Take 10 mg by mouth daily with lunch.   1   Calcium Carbonate-Vitamin D 600-400 MG-UNIT tablet Take 2 tablets by mouth daily with lunch.      diphenhydrAMINE (BENADRYL) 25 MG tablet Take 50 mg by mouth daily as needed (itching and allergies).     hydrochlorothiazide (HYDRODIURIL) 25 MG tablet Take 25 mg by mouth daily with lunch.      hydrocortisone 2.5 % cream Apply topically See admin instructions. Apply to itchy areas at face and body 1 - 2 times daily. Only  use for up to 2 weeks around eye area. (Patient taking differently: Apply 1 application  topically See admin instructions. Apply to itchy areas at face and body 1 - 2 times daily. Only use for up to 2 weeks around eye area. As needed) 60 g 2   loperamide (IMODIUM) 2 MG capsule Take 2-4 mg by mouth as needed for diarrhea or loose stools.     Multiple Vitamin (MULTI-VITAMINS) TABS Take 1 tablet by mouth daily with lunch.      Probiotic Product (PROBIOTIC DAILY PO) Take 1 capsule by mouth daily as needed (Take with dairy).     UNABLE TO FIND Take 1 tablet by mouth in the morning and at bedtime. Focus multivitamin eye     No current facility-administered medications for this visit.    Past Medical History:  Diagnosis Date   Arthritis    Asthma    Cataract cortical, senile, bilateral    Chicken pox    GERD (gastroesophageal reflux disease)    Headache    Hypercholesteremia    Hyperlipidemia    Hypertension    IBS (irritable bowel syndrome)    Lymphedema    bilateral legs and left arm   Osteoporosis    PONV (postoperative nausea and vomiting)    Pre-diabetes     Past Surgical History:  Procedure Laterality Date  ABDOMINAL HYSTERECTOMY  2004   APPENDECTOMY  1960   CATARACT EXTRACTION W/ INTRAOCULAR LENS IMPLANT Left 01/02/2019   CATARACT EXTRACTION W/ INTRAOCULAR LENS IMPLANT Right 01/16/2019   COLONOSCOPY WITH PROPOFOL N/A 03/29/2017   Procedure: COLONOSCOPY WITH PROPOFOL;  Surgeon: Lin Landsman, MD;  Location: [facility] ENDOSCOPY;  Service: Gastroenterology;  Laterality: N/A;   FOOT SURGERY Left    OPEN REDUCTION INTERNAL FIXATION (ORIF) DISTAL RADIAL FRACTURE Left 03/20/2020   Procedure: OPEN REDUCTION INTERNAL FIXATION (ORIF) DISTAL RADIAL FRACTURE;  Surgeon: Hessie Knows, MD;  Location: [facility] ORS;  Service: Orthopedics;  Laterality: Left;   TONSILLECTOMY  1955     Social History   Tobacco Use   Smoking status: Never   Smokeless tobacco: Never  Vaping Use   Vaping  Use: Never used  Substance Use Topics   Alcohol use: Yes    Comment: occassional   Drug use: No      Family History  Problem Relation Age of Onset   Stroke Mother    Dementia Mother    Alzheimer's disease Father    Kidney cancer Father    Liver cancer Father    Stroke Father    Heart Problems Father    Breast cancer Sister        late 87's   Breast cancer Maternal Aunt        3 aunts    Allergies  Allergen Reactions   Ace Inhibitors Anaphylaxis    Tongue swelling   Aspirin Anaphylaxis   Lisinopril Anaphylaxis   Naproxen Anaphylaxis    unknown   Nsaids Anaphylaxis   Latex Swelling   Sulfa Antibiotics Other (See Comments)    Childhood   REVIEW OF SYSTEMS (Negative unless checked)   Constitutional: '[]'$ Weight loss  '[]'$ Fever  '[]'$ Chills Cardiac: '[]'$ Chest pain   '[]'$ Chest pressure   '[]'$ Palpitations   '[]'$ Shortness of breath when laying flat   '[]'$ Shortness of breath at rest   '[]'$ Shortness of breath with exertion. Vascular:  '[]'$ Pain in legs with walking   '[]'$ Pain in legs at rest   '[]'$ Pain in legs when laying flat   '[]'$ Claudication   '[]'$ Pain in feet when walking  '[]'$ Pain in feet at rest  '[]'$ Pain in feet when laying flat   '[]'$ History of DVT   '[]'$ Phlebitis   '[x]'$ Swelling in legs   '[x]'$ Varicose veins   '[]'$ Non-healing ulcers Pulmonary:   '[]'$ Uses home oxygen   '[]'$ Productive cough   '[]'$ Hemoptysis   '[]'$ Wheeze  '[]'$ COPD   '[]'$ Asthma Neurologic:  '[]'$ Dizziness  '[]'$ Blackouts   '[]'$ Seizures   '[]'$ History of stroke   '[]'$ History of TIA  '[]'$ Aphasia   '[]'$ Temporary blindness   '[]'$ Dysphagia   '[]'$ Weakness or numbness in arms   '[]'$ Weakness or numbness in legs Musculoskeletal:  '[]'$ Arthritis   '[]'$ Joint swelling   '[]'$ Joint pain   '[]'$ Low back pain Hematologic:  '[]'$ Easy bruising  '[]'$ Easy bleeding   '[]'$ Hypercoagulable state   '[x]'$ Anemic  '[]'$ Hepatitis Gastrointestinal:  '[]'$ Blood in stool   '[]'$ Vomiting blood  '[x]'$ Gastroesophageal reflux/heartburn   '[]'$ Abdominal pain Genitourinary:  '[]'$ Chronic kidney disease   '[]'$ Difficult urination  '[]'$ Frequent urination  '[]'$ Burning  with urination   '[]'$ Hematuria Skin:  '[]'$ Rashes   '[]'$ Ulcers   '[]'$ Wounds Psychological:  '[x]'$ History of anxiety   '[]'$  History of major depression.  Physical Examination  BP (!) 149/78   Pulse 71   Ht '5\' 2"'$  (1.575 m)   Wt 165 lb (74.8 kg)   BMI 30.18 kg/m  Gen:  WD/WN, NAD Head: [place]/AT, No temporalis wasting. Ear/Nose/Throat: Hearing grossly intact, nares  w/o erythema or drainage Eyes: Conjunctiva clear. Sclera non-icteric Neck: Supple.  Trachea midline Pulmonary:  Good air movement, no use of accessory muscles.  Cardiac: RRR, no JVD Vascular:  Vessel Right Left  Radial Palpable Palpable                          PT Palpable Palpable  DP Palpable Palpable   Gastrointestinal: soft, non-tender/non-distended. No guarding/reflex.  Musculoskeletal: M/S 5/5 throughout.  No deformity or atrophy. No appreciable LE edema. Neurologic: Sensation grossly intact in extremities.  Symmetrical.  Speech is fluent.  Psychiatric: Judgment intact, Mood & affect appropriate for pt's clinical situation. Dermatologic: No rashes or ulcers noted.  No cellulitis or open wounds.      Labs No results found for this or any previous visit (from the past 2160 hour(s)).  Radiology No results found.  Assessment/Plan Benign essential HTN blood pressure control important in reducing the progression of atherosclerotic disease. On appropriate oral medications.  Lymphedema In general, she has good symptom control with her lymphedema pump as well as compression and elevation.  Her pump had dysfunction last night for the first time.  If this becomes a recurring problem, we will need to get the company out there to check the machine as it is quite old.  She has been using it for about 5 years now.  I will plan on seeing her back in 1 year.    Leotis Pain, MD  08/09/2022 1:56 PM    This note was created with Dragon medical transcription system.  Any errors from dictation are purely unintentional

## 2022-10-13 DIAGNOSIS — E7800 Pure hypercholesterolemia, unspecified: Secondary | ICD-10-CM | POA: Diagnosis not present

## 2022-10-13 DIAGNOSIS — I10 Essential (primary) hypertension: Secondary | ICD-10-CM | POA: Diagnosis not present

## 2022-10-13 DIAGNOSIS — R7303 Prediabetes: Secondary | ICD-10-CM | POA: Diagnosis not present

## 2022-10-20 ENCOUNTER — Other Ambulatory Visit: Payer: Self-pay | Admitting: Internal Medicine

## 2022-10-20 DIAGNOSIS — J45990 Exercise induced bronchospasm: Secondary | ICD-10-CM | POA: Diagnosis not present

## 2022-10-20 DIAGNOSIS — I10 Essential (primary) hypertension: Secondary | ICD-10-CM | POA: Diagnosis not present

## 2022-10-20 DIAGNOSIS — E7800 Pure hypercholesterolemia, unspecified: Secondary | ICD-10-CM | POA: Diagnosis not present

## 2022-10-20 DIAGNOSIS — Z0000 Encounter for general adult medical examination without abnormal findings: Secondary | ICD-10-CM | POA: Diagnosis not present

## 2022-10-20 DIAGNOSIS — Z1231 Encounter for screening mammogram for malignant neoplasm of breast: Secondary | ICD-10-CM | POA: Diagnosis not present

## 2022-10-20 DIAGNOSIS — Z1211 Encounter for screening for malignant neoplasm of colon: Secondary | ICD-10-CM | POA: Diagnosis not present

## 2022-10-20 DIAGNOSIS — R7303 Prediabetes: Secondary | ICD-10-CM | POA: Diagnosis not present

## 2022-10-21 ENCOUNTER — Ambulatory Visit
Admission: RE | Admit: 2022-10-21 | Discharge: 2022-10-21 | Disposition: A | Discharge status: Home / Self Care | Payer: PPO | Source: Ambulatory Visit | Attending: Internal Medicine | Admitting: Internal Medicine

## 2022-10-21 DIAGNOSIS — Z1231 Encounter for screening mammogram for malignant neoplasm of breast: Secondary | ICD-10-CM | POA: Insufficient documentation

## 2023-03-04 DIAGNOSIS — L247 Irritant contact dermatitis due to plants, except food: Secondary | ICD-10-CM | POA: Diagnosis not present

## 2023-03-10 DIAGNOSIS — L237 Allergic contact dermatitis due to plants, except food: Secondary | ICD-10-CM | POA: Diagnosis not present

## 2023-03-10 DIAGNOSIS — L03113 Cellulitis of right upper limb: Secondary | ICD-10-CM | POA: Diagnosis not present

## 2023-03-30 DIAGNOSIS — J45990 Exercise induced bronchospasm: Secondary | ICD-10-CM | POA: Diagnosis not present

## 2023-03-30 DIAGNOSIS — I10 Essential (primary) hypertension: Secondary | ICD-10-CM | POA: Diagnosis not present

## 2023-03-30 DIAGNOSIS — K582 Mixed irritable bowel syndrome: Secondary | ICD-10-CM | POA: Diagnosis not present

## 2023-03-30 DIAGNOSIS — Z23 Encounter for immunization: Secondary | ICD-10-CM | POA: Diagnosis not present

## 2023-03-30 DIAGNOSIS — Z86010 Personal history of colonic polyps: Secondary | ICD-10-CM | POA: Diagnosis not present

## 2023-03-30 DIAGNOSIS — K219 Gastro-esophageal reflux disease without esophagitis: Secondary | ICD-10-CM | POA: Diagnosis not present

## 2023-04-17 ENCOUNTER — Ambulatory Visit: Payer: Self-pay

## 2023-04-17 DIAGNOSIS — Z86010 Personal history of colonic polyps: Secondary | ICD-10-CM | POA: Diagnosis not present

## 2023-04-17 DIAGNOSIS — Z1211 Encounter for screening for malignant neoplasm of colon: Secondary | ICD-10-CM | POA: Diagnosis not present

## 2023-04-17 DIAGNOSIS — D122 Benign neoplasm of ascending colon: Secondary | ICD-10-CM | POA: Diagnosis not present

## 2023-04-17 DIAGNOSIS — Z09 Encounter for follow-up examination after completed treatment for conditions other than malignant neoplasm: Secondary | ICD-10-CM | POA: Diagnosis not present

## 2023-04-26 DIAGNOSIS — H353131 Nonexudative age-related macular degeneration, bilateral, early dry stage: Secondary | ICD-10-CM | POA: Diagnosis not present

## 2023-04-26 DIAGNOSIS — H524 Presbyopia: Secondary | ICD-10-CM | POA: Diagnosis not present

## 2023-04-26 DIAGNOSIS — H26493 Other secondary cataract, bilateral: Secondary | ICD-10-CM | POA: Diagnosis not present

## 2023-04-26 DIAGNOSIS — Z961 Presence of intraocular lens: Secondary | ICD-10-CM | POA: Diagnosis not present

## 2023-04-26 DIAGNOSIS — H5203 Hypermetropia, bilateral: Secondary | ICD-10-CM | POA: Diagnosis not present

## 2023-04-26 DIAGNOSIS — H52223 Regular astigmatism, bilateral: Secondary | ICD-10-CM | POA: Diagnosis not present

## 2023-06-08 DIAGNOSIS — H26491 Other secondary cataract, right eye: Secondary | ICD-10-CM | POA: Diagnosis not present

## 2023-06-08 DIAGNOSIS — H18413 Arcus senilis, bilateral: Secondary | ICD-10-CM | POA: Diagnosis not present

## 2023-06-08 DIAGNOSIS — H353131 Nonexudative age-related macular degeneration, bilateral, early dry stage: Secondary | ICD-10-CM | POA: Diagnosis not present

## 2023-06-08 DIAGNOSIS — Z961 Presence of intraocular lens: Secondary | ICD-10-CM | POA: Diagnosis not present

## 2023-06-08 DIAGNOSIS — H26493 Other secondary cataract, bilateral: Secondary | ICD-10-CM | POA: Diagnosis not present

## 2023-06-12 DIAGNOSIS — I10 Essential (primary) hypertension: Secondary | ICD-10-CM | POA: Diagnosis not present

## 2023-06-12 DIAGNOSIS — R7303 Prediabetes: Secondary | ICD-10-CM | POA: Diagnosis not present

## 2023-06-12 DIAGNOSIS — E7800 Pure hypercholesterolemia, unspecified: Secondary | ICD-10-CM | POA: Diagnosis not present

## 2023-06-15 DIAGNOSIS — H26491 Other secondary cataract, right eye: Secondary | ICD-10-CM | POA: Diagnosis not present

## 2023-06-15 DIAGNOSIS — H5203 Hypermetropia, bilateral: Secondary | ICD-10-CM | POA: Diagnosis not present

## 2023-06-15 DIAGNOSIS — H52223 Regular astigmatism, bilateral: Secondary | ICD-10-CM | POA: Diagnosis not present

## 2023-06-15 DIAGNOSIS — H353131 Nonexudative age-related macular degeneration, bilateral, early dry stage: Secondary | ICD-10-CM | POA: Diagnosis not present

## 2023-06-15 DIAGNOSIS — H524 Presbyopia: Secondary | ICD-10-CM | POA: Diagnosis not present

## 2023-06-15 DIAGNOSIS — H26492 Other secondary cataract, left eye: Secondary | ICD-10-CM | POA: Diagnosis not present

## 2023-06-19 DIAGNOSIS — J45990 Exercise induced bronchospasm: Secondary | ICD-10-CM | POA: Diagnosis not present

## 2023-06-19 DIAGNOSIS — E7800 Pure hypercholesterolemia, unspecified: Secondary | ICD-10-CM | POA: Diagnosis not present

## 2023-06-19 DIAGNOSIS — R7303 Prediabetes: Secondary | ICD-10-CM | POA: Diagnosis not present

## 2023-06-19 DIAGNOSIS — I10 Essential (primary) hypertension: Secondary | ICD-10-CM | POA: Diagnosis not present

## 2023-06-19 DIAGNOSIS — K219 Gastro-esophageal reflux disease without esophagitis: Secondary | ICD-10-CM | POA: Diagnosis not present

## 2023-07-06 DIAGNOSIS — H26492 Other secondary cataract, left eye: Secondary | ICD-10-CM | POA: Diagnosis not present

## 2023-07-13 DIAGNOSIS — H353131 Nonexudative age-related macular degeneration, bilateral, early dry stage: Secondary | ICD-10-CM | POA: Diagnosis not present

## 2023-07-13 DIAGNOSIS — H5203 Hypermetropia, bilateral: Secondary | ICD-10-CM | POA: Diagnosis not present

## 2023-07-13 DIAGNOSIS — H26492 Other secondary cataract, left eye: Secondary | ICD-10-CM | POA: Diagnosis not present

## 2023-07-13 DIAGNOSIS — H524 Presbyopia: Secondary | ICD-10-CM | POA: Diagnosis not present

## 2023-07-13 DIAGNOSIS — H52223 Regular astigmatism, bilateral: Secondary | ICD-10-CM | POA: Diagnosis not present

## 2023-07-13 DIAGNOSIS — Z961 Presence of intraocular lens: Secondary | ICD-10-CM | POA: Diagnosis not present

## 2023-08-15 ENCOUNTER — Ambulatory Visit (INDEPENDENT_AMBULATORY_CARE_PROVIDER_SITE_OTHER): Payer: PPO | Admitting: Vascular Surgery

## 2023-09-12 ENCOUNTER — Ambulatory Visit (INDEPENDENT_AMBULATORY_CARE_PROVIDER_SITE_OTHER): Payer: PPO | Admitting: Vascular Surgery

## 2023-09-29 ENCOUNTER — Ambulatory Visit (INDEPENDENT_AMBULATORY_CARE_PROVIDER_SITE_OTHER): Payer: PPO | Admitting: Vascular Surgery

## 2023-10-03 ENCOUNTER — Ambulatory Visit (INDEPENDENT_AMBULATORY_CARE_PROVIDER_SITE_OTHER): Admitting: Vascular Surgery

## 2023-10-05 ENCOUNTER — Telehealth (INDEPENDENT_AMBULATORY_CARE_PROVIDER_SITE_OTHER): Payer: Self-pay | Admitting: Vascular Surgery

## 2023-10-05 NOTE — Telephone Encounter (Signed)
 Patient has been rescheduled x3 with [facility] due to last minute clinic appt schedule. Patient is now rescheduled to come in 10/17/23. Patient was asking though if [facility] is able to order her a new Lymp machine. Her last lymp machine was ordered in 2019 and she really needs a new one. Please advise.

## 2023-10-06 ENCOUNTER — Ambulatory Visit (INDEPENDENT_AMBULATORY_CARE_PROVIDER_SITE_OTHER): Admitting: Vascular Surgery

## 2023-10-06 NOTE — Telephone Encounter (Signed)
 Patient was informed that we need a current office note and then we can proceed with ordering new lymphedema pump.

## 2023-10-17 ENCOUNTER — Encounter (INDEPENDENT_AMBULATORY_CARE_PROVIDER_SITE_OTHER): Payer: Self-pay | Admitting: Vascular Surgery

## 2023-10-17 ENCOUNTER — Ambulatory Visit (INDEPENDENT_AMBULATORY_CARE_PROVIDER_SITE_OTHER): Admitting: Vascular Surgery

## 2023-10-17 VITALS — BP 165/89 | HR 80 | Resp 18 | Ht 65.0 in | Wt 170.2 lb

## 2023-10-17 DIAGNOSIS — I890 Lymphedema, not elsewhere classified: Secondary | ICD-10-CM | POA: Diagnosis not present

## 2023-10-17 DIAGNOSIS — I10 Essential (primary) hypertension: Secondary | ICD-10-CM | POA: Diagnosis not present

## 2023-10-17 NOTE — Assessment & Plan Note (Signed)
 The patient has longstanding lymphedema and continues to do all of her appropriate conservative measures.  Unfortunately, her old lymphedema pump is no longer functioning and this has resulted in significant deterioration in her symptoms particularly on the left leg.  We need to get her a new lymphedema pump as soon as possible and I believe this will help her symptoms significantly as this was controlling it well for many years.  We will shorten her follow-up to about 6 months to reassess.

## 2023-10-17 NOTE — Assessment & Plan Note (Signed)
 blood pressure control important in reducing the progression of atherosclerotic disease. On appropriate oral medications.

## 2023-10-17 NOTE — Progress Notes (Signed)
 MRN : [id number]  Summer Bishop is a 73 y.o. (01/08/1951) female who presents with chief complaint of  Chief Complaint  Patient presents with   Follow-up    f/u in 1 year with no studies  .  History of Present Illness: Patient returns today in follow up of lymphedema and leg swelling.  Over the past few months, her lymphedema pump has malfunctioned and is not currently working well.  This has resulted in significant worsening in the pain and swelling particularly in her left leg.  She had been stable using her lymphedema pump for about 5 years.  She continues to wear her 20 to 30 mmHg compression socks on a daily basis and elevate her legs.  She continues to be active.  No new ulceration or infection.  Current Outpatient Medications  Medication Sig Dispense Refill   acetaminophen (TYLENOL) 500 MG tablet Take 500-1,000 mg by mouth every 6 (six) hours as needed for mild pain or headache.     albuterol (PROVENTIL HFA;VENTOLIN HFA) 108 (90 Base) MCG/ACT inhaler Inhale 2 puffs into the lungs every 6 (six) hours as needed for wheezing or shortness of breath. 1 Inhaler 0   alendronate (FOSAMAX) 70 MG tablet Take 1 tablet (70 mg total) by mouth every 7 (seven) days. Take with a full glass of water on an empty stomach. (Patient taking differently: Take 70 mg by mouth every Saturday. Take with a full glass of water on an empty stomach.) 4 tablet 11   amLODipine (NORVASC) 10 MG tablet Take 10 mg by mouth daily with lunch.   1   Calcium Carbonate-Vitamin D 600-400 MG-UNIT tablet Take 2 tablets by mouth daily with lunch.      diphenhydrAMINE (BENADRYL) 25 MG tablet Take 50 mg by mouth daily as needed (itching and allergies).     hydrochlorothiazide (HYDRODIURIL) 25 MG tablet Take 25 mg by mouth daily with lunch.      hydrocortisone 2.5 % cream Apply topically See admin instructions. Apply to itchy areas at face and body 1 - 2 times daily. Only use for up to 2 weeks around eye area. (Patient taking  differently: Apply 1 application  topically See admin instructions. Apply to itchy areas at face and body 1 - 2 times daily. Only use for up to 2 weeks around eye area. As needed) 60 g 2   loperamide (IMODIUM) 2 MG capsule Take 2-4 mg by mouth as needed for diarrhea or loose stools.     Multiple Vitamin (MULTI-VITAMINS) TABS Take 1 tablet by mouth daily with lunch.      Probiotic Product (PROBIOTIC DAILY PO) Take 1 capsule by mouth daily as needed (Take with dairy).     UNABLE TO FIND Take 1 tablet by mouth in the morning and at bedtime. Focus multivitamin eye     No current facility-administered medications for this visit.    Past Medical History:  Diagnosis Date   Arthritis    Asthma    Cataract cortical, senile, bilateral    Chicken pox    GERD (gastroesophageal reflux disease)    Headache    Hypercholesteremia    Hyperlipidemia    Hypertension    IBS (irritable bowel syndrome)    Lymphedema    bilateral legs and left arm   Osteoporosis    PONV (postoperative nausea and vomiting)    Pre-diabetes     Past Surgical History:  Procedure Laterality Date   ABDOMINAL HYSTERECTOMY  2004   APPENDECTOMY  1960   CATARACT EXTRACTION W/ INTRAOCULAR LENS IMPLANT Left 01/02/2019   CATARACT EXTRACTION W/ INTRAOCULAR LENS IMPLANT Right 01/16/2019   COLONOSCOPY WITH PROPOFOL N/A 03/29/2017   Procedure: COLONOSCOPY WITH PROPOFOL;  Surgeon: Toney Reil, MD;  Location: [facility] ENDOSCOPY;  Service: Gastroenterology;  Laterality: N/A;   FOOT SURGERY Left    OPEN REDUCTION INTERNAL FIXATION (ORIF) DISTAL RADIAL FRACTURE Left 03/20/2020   Procedure: OPEN REDUCTION INTERNAL FIXATION (ORIF) DISTAL RADIAL FRACTURE;  Surgeon: Kennedy Bucker, MD;  Location: [facility] ORS;  Service: Orthopedics;  Laterality: Left;   TONSILLECTOMY  1955     Social History   Tobacco Use   Smoking status: Never   Smokeless tobacco: Never  Vaping Use   Vaping status: Never Used  Substance Use Topics   Alcohol use:  Yes    Comment: occassional   Drug use: No      Family History  Problem Relation Age of Onset   Stroke Mother    Dementia Mother    Alzheimer's disease Father    Kidney cancer Father    Liver cancer Father    Stroke Father    Heart Problems Father    Breast cancer Sister        late 37's   Breast cancer Maternal Aunt        3 aunts     Allergies  Allergen Reactions   Ace Inhibitors Anaphylaxis    Tongue swelling   Aspirin Anaphylaxis   Lisinopril Anaphylaxis   Naproxen Anaphylaxis    unknown   Nsaids Anaphylaxis   Latex Swelling   Sulfa Antibiotics Other (See Comments)    Childhood    REVIEW OF SYSTEMS (Negative unless checked)   Constitutional: [] Weight loss  [] Fever  [] Chills Cardiac: [] Chest pain   [] Chest pressure   [] Palpitations   [] Shortness of breath when laying flat   [] Shortness of breath at rest   [] Shortness of breath with exertion. Vascular:  [] Pain in legs with walking   [] Pain in legs at rest   [] Pain in legs when laying flat   [] Claudication   [] Pain in feet when walking  [] Pain in feet at rest  [] Pain in feet when laying flat   [] History of DVT   [] Phlebitis   [x] Swelling in legs   [x] Varicose veins   [] Non-healing ulcers Pulmonary:   [] Uses home oxygen   [] Productive cough   [] Hemoptysis   [] Wheeze  [] COPD   [] Asthma Neurologic:  [] Dizziness  [] Blackouts   [] Seizures   [] History of stroke   [] History of TIA  [] Aphasia   [] Temporary blindness   [] Dysphagia   [] Weakness or numbness in arms   [] Weakness or numbness in legs Musculoskeletal:  [] Arthritis   [] Joint swelling   [] Joint pain   [] Low back pain Hematologic:  [] Easy bruising  [] Easy bleeding   [] Hypercoagulable state   [x] Anemic  [] Hepatitis Gastrointestinal:  [] Blood in stool   [] Vomiting blood  [x] Gastroesophageal reflux/heartburn   [] Abdominal pain Genitourinary:  [] Chronic kidney disease   [] Difficult urination  [] Frequent urination  [] Burning with urination   [] Hematuria Skin:  [] Rashes    [] Ulcers   [] Wounds Psychological:  [x] History of anxiety   []  History of major depression.  Physical Examination  BP (!) 165/89   Pulse 80   Resp 18   Ht 5\' 5"  (1.651 m)   Wt 170 lb 3.2 oz (77.2 kg)   BMI 28.32 kg/m  Gen:  WD/WN, NAD. Appears younger than stated age. Head: [place]/AT, No temporalis wasting.  Ear/Nose/Throat: Hearing grossly intact, nares w/o erythema or drainage Eyes: Conjunctiva clear. Sclera non-icteric Neck: Supple.  Trachea midline Pulmonary:  Good air movement, no use of accessory muscles.  Cardiac: RRR, no JVD Vascular:  Vessel Right Left  Radial Palpable Palpable                          PT Palpable Palpable  DP Palpable Palpable   Gastrointestinal: soft, non-tender/non-distended. No guarding/reflex.  Musculoskeletal: M/S 5/5 throughout.  No deformity or atrophy. Trace RLE edema, 1+ LLE edema. Neurologic: Sensation grossly intact in extremities.  Symmetrical.  Speech is fluent.  Psychiatric: Judgment intact, Mood & affect appropriate for pt's clinical situation. Dermatologic: No rashes or ulcers noted.  No cellulitis or open wounds.      Labs No results found for this or any previous visit (from the past 2160 hours).  Radiology No results found.  Assessment/Plan  Benign essential HTN blood pressure control important in reducing the progression of atherosclerotic disease. On appropriate oral medications.   Lymphedema The patient has longstanding lymphedema and continues to do all of her appropriate conservative measures.  Unfortunately, her old lymphedema pump is no longer functioning and this has resulted in significant deterioration in her symptoms particularly on the left leg.  We need to get her a new lymphedema pump as soon as possible and I believe this will help her symptoms significantly as this was controlling it well for many years.  We will shorten her follow-up to about 6 months to reassess.    Festus Barren, MD  10/17/2023 11:40  AM    This note was created with Dragon medical transcription system.  Any errors from dictation are purely unintentional

## 2023-12-12 ENCOUNTER — Encounter (INDEPENDENT_AMBULATORY_CARE_PROVIDER_SITE_OTHER): Payer: Self-pay

## 2023-12-14 DIAGNOSIS — I10 Essential (primary) hypertension: Secondary | ICD-10-CM | POA: Diagnosis not present

## 2023-12-14 DIAGNOSIS — R7303 Prediabetes: Secondary | ICD-10-CM | POA: Diagnosis not present

## 2023-12-14 DIAGNOSIS — E7800 Pure hypercholesterolemia, unspecified: Secondary | ICD-10-CM | POA: Diagnosis not present

## 2023-12-14 DIAGNOSIS — I890 Lymphedema, not elsewhere classified: Secondary | ICD-10-CM | POA: Diagnosis not present

## 2023-12-21 ENCOUNTER — Other Ambulatory Visit: Payer: Self-pay | Admitting: Internal Medicine

## 2023-12-21 DIAGNOSIS — Z0000 Encounter for general adult medical examination without abnormal findings: Secondary | ICD-10-CM | POA: Diagnosis not present

## 2023-12-21 DIAGNOSIS — E7800 Pure hypercholesterolemia, unspecified: Secondary | ICD-10-CM | POA: Diagnosis not present

## 2023-12-21 DIAGNOSIS — I10 Essential (primary) hypertension: Secondary | ICD-10-CM | POA: Diagnosis not present

## 2023-12-21 DIAGNOSIS — Z1331 Encounter for screening for depression: Secondary | ICD-10-CM | POA: Diagnosis not present

## 2023-12-21 DIAGNOSIS — J45990 Exercise induced bronchospasm: Secondary | ICD-10-CM | POA: Diagnosis not present

## 2023-12-21 DIAGNOSIS — R1032 Left lower quadrant pain: Secondary | ICD-10-CM | POA: Diagnosis not present

## 2023-12-21 DIAGNOSIS — R7303 Prediabetes: Secondary | ICD-10-CM | POA: Diagnosis not present

## 2023-12-21 DIAGNOSIS — Z1231 Encounter for screening mammogram for malignant neoplasm of breast: Secondary | ICD-10-CM | POA: Diagnosis not present

## 2024-01-11 ENCOUNTER — Ambulatory Visit
Admission: RE | Admit: 2024-01-11 | Discharge: 2024-01-11 | Disposition: A | Discharge status: Home / Self Care | Source: Ambulatory Visit | Attending: Internal Medicine | Admitting: Internal Medicine

## 2024-01-11 DIAGNOSIS — Z1231 Encounter for screening mammogram for malignant neoplasm of breast: Secondary | ICD-10-CM | POA: Insufficient documentation

## 2024-04-19 ENCOUNTER — Ambulatory Visit (INDEPENDENT_AMBULATORY_CARE_PROVIDER_SITE_OTHER): Admitting: Vascular Surgery

## 2024-04-30 ENCOUNTER — Encounter (INDEPENDENT_AMBULATORY_CARE_PROVIDER_SITE_OTHER): Payer: Self-pay | Admitting: Vascular Surgery

## 2024-04-30 ENCOUNTER — Ambulatory Visit (INDEPENDENT_AMBULATORY_CARE_PROVIDER_SITE_OTHER): Admitting: Vascular Surgery

## 2024-04-30 VITALS — BP 138/85 | HR 72 | Resp 18 | Wt 165.6 lb

## 2024-04-30 DIAGNOSIS — I10 Essential (primary) hypertension: Secondary | ICD-10-CM | POA: Diagnosis not present

## 2024-04-30 DIAGNOSIS — I890 Lymphedema, not elsewhere classified: Secondary | ICD-10-CM

## 2024-04-30 NOTE — Addendum Note (Signed)
 Addended by: MAREA SELINDA RAMAN on: 04/30/2024 12:03 PM   Modules accepted: Level of Service

## 2024-04-30 NOTE — Progress Notes (Signed)
 MRN : [id number]  Summer Bishop is a 73 y.o. (05-22-1951) female who presents with chief complaint of  Chief Complaint  Patient presents with   Follow-up    1 yr follow up  .  History of Present Illness: Patient returns today in follow up of her lymphedema and leg swelling.  She has gotten a functional lymphedema pump and is using this regularly in conjunction with 20 to 30 mmHg compression socks, leg elevation, and exercise.  She has had 1 flare where her left lower extremity swelled significantly but otherwise she has been doing quite well.  Her swelling is under much better control than it was at her last visit.  Current Outpatient Medications  Medication Sig Dispense Refill   acetaminophen  (TYLENOL ) 500 MG tablet Take 500-1,000 mg by mouth every 6 (six) hours as needed for mild pain or headache.     albuterol  (PROVENTIL  HFA;VENTOLIN  HFA) 108 (90 Base) MCG/ACT inhaler Inhale 2 puffs into the lungs every 6 (six) hours as needed for wheezing or shortness of breath. 1 Inhaler 0   alendronate  (FOSAMAX ) 70 MG tablet Take 1 tablet (70 mg total) by mouth every 7 (seven) days. Take with a full glass of water on an empty stomach. (Patient taking differently: Take 70 mg by mouth every Saturday. Take with a full glass of water on an empty stomach.) 4 tablet 11   amLODipine (NORVASC) 10 MG tablet Take 10 mg by mouth daily with lunch.   1   Calcium Carbonate-Vitamin D 600-400 MG-UNIT tablet Take 2 tablets by mouth daily with lunch.      diphenhydrAMINE (BENADRYL) 25 MG tablet Take 50 mg by mouth daily as needed (itching and allergies).     hydrochlorothiazide (HYDRODIURIL) 25 MG tablet Take 25 mg by mouth daily with lunch.      hydrocortisone  2.5 % cream Apply topically See admin instructions. Apply to itchy areas at face and body 1 - 2 times daily. Only use for up to 2 weeks around eye area. (Patient taking differently: Apply 1 application  topically See admin instructions. Apply to itchy areas at  face and body 1 - 2 times daily. Only use for up to 2 weeks around eye area. As needed) 60 g 2   loperamide (IMODIUM) 2 MG capsule Take 2-4 mg by mouth as needed for diarrhea or loose stools.     Multiple Vitamin (MULTI-VITAMINS) TABS Take 1 tablet by mouth daily with lunch.      Probiotic Product (PROBIOTIC DAILY PO) Take 1 capsule by mouth daily as needed (Take with dairy).     UNABLE TO FIND Take 1 tablet by mouth in the morning and at bedtime. Focus multivitamin eye     No current facility-administered medications for this visit.    Past Medical History:  Diagnosis Date   Arthritis    Asthma    Cataract cortical, senile, bilateral    Chicken pox    GERD (gastroesophageal reflux disease)    Headache    Hypercholesteremia    Hyperlipidemia    Hypertension    IBS (irritable bowel syndrome)    Lymphedema    bilateral legs and left arm   Osteoporosis    PONV (postoperative nausea and vomiting)    Pre-diabetes     Past Surgical History:  Procedure Laterality Date   ABDOMINAL HYSTERECTOMY  2004   APPENDECTOMY  1960   CATARACT EXTRACTION W/ INTRAOCULAR LENS IMPLANT Left 01/02/2019   CATARACT EXTRACTION W/ INTRAOCULAR LENS  IMPLANT Right 01/16/2019   COLONOSCOPY WITH PROPOFOL  N/A 03/29/2017   Procedure: COLONOSCOPY WITH PROPOFOL ;  Surgeon: Unk Corinn Skiff, MD;  Location: [facility] ENDOSCOPY;  Service: Gastroenterology;  Laterality: N/A;   FOOT SURGERY Left    OPEN REDUCTION INTERNAL FIXATION (ORIF) DISTAL RADIAL FRACTURE Left 03/20/2020   Procedure: OPEN REDUCTION INTERNAL FIXATION (ORIF) DISTAL RADIAL FRACTURE;  Surgeon: Kathlynn Sharper, MD;  Location: [facility] ORS;  Service: Orthopedics;  Laterality: Left;   TONSILLECTOMY  1955     Social History   Tobacco Use   Smoking status: Never   Smokeless tobacco: Never  Vaping Use   Vaping status: Never Used  Substance Use Topics   Alcohol use: Yes    Comment: occassional   Drug use: No      Family History  Problem Relation Age  of Onset   Stroke Mother    Dementia Mother    Alzheimer's disease Father    Kidney cancer Father    Liver cancer Father    Stroke Father    Heart Problems Father    Breast cancer Sister        late 9's   Breast cancer Maternal Aunt        3 aunts     Allergies  Allergen Reactions   Ace Inhibitors Anaphylaxis    Tongue swelling   Aspirin Anaphylaxis   Lisinopril Anaphylaxis   Naproxen Anaphylaxis    unknown   Nsaids Anaphylaxis   Latex Swelling   Sulfa Antibiotics Other (See Comments)    Childhood      REVIEW OF SYSTEMS (Negative unless checked)   Constitutional: [] Weight loss  [] Fever  [] Chills Cardiac: [] Chest pain   [] Chest pressure   [] Palpitations   [] Shortness of breath when laying flat   [] Shortness of breath at rest   [] Shortness of breath with exertion. Vascular:  [] Pain in legs with walking   [] Pain in legs at rest   [] Pain in legs when laying flat   [] Claudication   [] Pain in feet when walking  [] Pain in feet at rest  [] Pain in feet when laying flat   [] History of DVT   [] Phlebitis   [x] Swelling in legs   [x] Varicose veins   [] Non-healing ulcers Pulmonary:   [] Uses home oxygen   [] Productive cough   [] Hemoptysis   [] Wheeze  [] COPD   [] Asthma Neurologic:  [] Dizziness  [] Blackouts   [] Seizures   [] History of stroke   [] History of TIA  [] Aphasia   [] Temporary blindness   [] Dysphagia   [] Weakness or numbness in arms   [] Weakness or numbness in legs Musculoskeletal:  [] Arthritis   [] Joint swelling   [] Joint pain   [] Low back pain Hematologic:  [] Easy bruising  [] Easy bleeding   [] Hypercoagulable state   [x] Anemic  [] Hepatitis Gastrointestinal:  [] Blood in stool   [] Vomiting blood  [x] Gastroesophageal reflux/heartburn   [] Abdominal pain Genitourinary:  [] Chronic kidney disease   [] Difficult urination  [] Frequent urination  [] Burning with urination   [] Hematuria Skin:  [] Rashes   [] Ulcers   [] Wounds Psychological:  [x] History of anxiety   []  History of major  depression.  Physical Examination  BP 138/85   Pulse 72   Resp 18   Wt 165 lb 9.6 oz (75.1 kg)   BMI 27.56 kg/m  Gen:  WD/WN, NAD.  Appears younger than stated age Head: [place]/AT, No temporalis wasting. Ear/Nose/Throat: Hearing grossly intact, nares w/o erythema or drainage Eyes: Conjunctiva clear. Sclera non-icteric Neck: Supple.  Trachea midline Pulmonary:  Good air  movement, no use of accessory muscles.  Cardiac: RRR, no JVD Vascular:  Vessel Right Left  Radial Palpable Palpable                          PT Palpable Palpable  DP Palpable Palpable   Gastrointestinal: soft, non-tender/non-distended. No guarding/reflex.  Musculoskeletal: M/S 5/5 throughout.  No deformity or atrophy.  No right lower extremity edema, trace left lower extremity edema. Neurologic: Sensation grossly intact in extremities.  Symmetrical.  Speech is fluent.  Psychiatric: Judgment intact, Mood & affect appropriate for pt's clinical situation. Dermatologic: No rashes or ulcers noted.  No cellulitis or open wounds.      Labs No results found for this or any previous visit (from the past 2160 hours).  Radiology No results found.  Assessment/Plan  Lymphedema Stable.  Swelling under much better control after getting a functional lymphedema pump.  We will go back to an annual follow-up at this point.  Essential hypertension blood pressure control important in reducing the progression of atherosclerotic disease. On appropriate oral medications.    Selinda Gu, MD  04/30/2024 11:29 AM    This note was created with Dragon medical transcription system.  Any errors from dictation are purely unintentional

## 2024-04-30 NOTE — Assessment & Plan Note (Signed)
 blood pressure control important in reducing the progression of atherosclerotic disease. On appropriate oral medications.

## 2024-04-30 NOTE — Assessment & Plan Note (Signed)
 Stable.  Swelling under much better control after getting a functional lymphedema pump.  We will go back to an annual follow-up at this point.

## 2024-06-18 DIAGNOSIS — H52223 Regular astigmatism, bilateral: Secondary | ICD-10-CM | POA: Diagnosis not present

## 2024-06-18 DIAGNOSIS — Z961 Presence of intraocular lens: Secondary | ICD-10-CM | POA: Diagnosis not present

## 2024-06-18 DIAGNOSIS — H353131 Nonexudative age-related macular degeneration, bilateral, early dry stage: Secondary | ICD-10-CM | POA: Diagnosis not present

## 2024-06-18 DIAGNOSIS — H5203 Hypermetropia, bilateral: Secondary | ICD-10-CM | POA: Diagnosis not present

## 2024-06-18 DIAGNOSIS — H524 Presbyopia: Secondary | ICD-10-CM | POA: Diagnosis not present

## 2024-06-24 DIAGNOSIS — R7303 Prediabetes: Secondary | ICD-10-CM | POA: Diagnosis not present

## 2024-06-24 DIAGNOSIS — E7800 Pure hypercholesterolemia, unspecified: Secondary | ICD-10-CM | POA: Diagnosis not present

## 2024-06-24 DIAGNOSIS — I10 Essential (primary) hypertension: Secondary | ICD-10-CM | POA: Diagnosis not present

## 2024-06-27 DIAGNOSIS — E7800 Pure hypercholesterolemia, unspecified: Secondary | ICD-10-CM | POA: Diagnosis not present

## 2024-06-27 DIAGNOSIS — J45990 Exercise induced bronchospasm: Secondary | ICD-10-CM | POA: Diagnosis not present

## 2024-06-27 DIAGNOSIS — R7303 Prediabetes: Secondary | ICD-10-CM | POA: Diagnosis not present

## 2024-06-27 DIAGNOSIS — I10 Essential (primary) hypertension: Secondary | ICD-10-CM | POA: Diagnosis not present

## 2025-04-29 ENCOUNTER — Ambulatory Visit (INDEPENDENT_AMBULATORY_CARE_PROVIDER_SITE_OTHER): Admitting: Vascular Surgery
